# Patient Record
Sex: Female | Born: 1975 | Hispanic: Yes | Smoking: Current some day smoker
Health system: Southern US, Community
[De-identification: ages and names within clinical notes are randomized; demographics above are authoritative.]

## PROBLEM LIST (undated history)

## (undated) DIAGNOSIS — I10 Essential (primary) hypertension: Secondary | ICD-10-CM

## (undated) DIAGNOSIS — R519 Headache, unspecified: Secondary | ICD-10-CM

## (undated) DIAGNOSIS — R079 Chest pain, unspecified: Secondary | ICD-10-CM

## (undated) DIAGNOSIS — R002 Palpitations: Secondary | ICD-10-CM

## (undated) DIAGNOSIS — F419 Anxiety disorder, unspecified: Secondary | ICD-10-CM

## (undated) DIAGNOSIS — K5190 Ulcerative colitis, unspecified, without complications: Secondary | ICD-10-CM

## (undated) HISTORY — DX: Ulcerative colitis, unspecified, without complications: K51.90

## (undated) HISTORY — DX: Headache, unspecified: R51.9

## (undated) HISTORY — DX: Chest pain, unspecified: R07.9

## (undated) HISTORY — PX: ACHILLES TENDON SURGERY: SHX542

## (undated) HISTORY — DX: Palpitations: R00.2

## (undated) HISTORY — DX: Essential (primary) hypertension: I10

## (undated) HISTORY — DX: Anxiety disorder, unspecified: F41.9

## (undated) HISTORY — PX: APPENDECTOMY: SHX54

---

## 2016-04-09 ENCOUNTER — Telehealth: Payer: Self-pay | Admitting: *Deleted

## 2016-04-09 ENCOUNTER — Encounter: Payer: Self-pay | Admitting: Sports Medicine

## 2016-04-09 ENCOUNTER — Ambulatory Visit (INDEPENDENT_AMBULATORY_CARE_PROVIDER_SITE_OTHER): Payer: 59

## 2016-04-09 ENCOUNTER — Ambulatory Visit (INDEPENDENT_AMBULATORY_CARE_PROVIDER_SITE_OTHER): Payer: 59 | Admitting: Sports Medicine

## 2016-04-09 DIAGNOSIS — M799 Soft tissue disorder, unspecified: Secondary | ICD-10-CM

## 2016-04-09 DIAGNOSIS — L02619 Cutaneous abscess of unspecified foot: Secondary | ICD-10-CM

## 2016-04-09 DIAGNOSIS — M7989 Other specified soft tissue disorders: Secondary | ICD-10-CM

## 2016-04-09 DIAGNOSIS — L03119 Cellulitis of unspecified part of limb: Secondary | ICD-10-CM

## 2016-04-09 DIAGNOSIS — M79671 Pain in right foot: Secondary | ICD-10-CM

## 2016-04-09 MED ORDER — AMOXICILLIN-POT CLAVULANATE 875-125 MG PO TABS: 1.0000 | ORAL_TABLET | Freq: Two times a day (BID) | ORAL | 0 refills | Status: DC

## 2016-04-09 MED ORDER — METHYLPREDNISOLONE 4 MG PO TBPK: ORAL_TABLET | ORAL | 0 refills | Status: DC

## 2016-04-09 NOTE — Telephone Encounter (Addendum)
-----   Message from [place] St[place], [place] sent at 04/09/2016  9:40 AM EST ----- Regarding: MRI Right posterior heel Large soft tissue mass near achilles History of surgery as a child with this lump appearing 2 years ago with pain and swelling and drainage  Dr Kathie RhodesS. Orders given to D. Meadows for [employer].

## 2016-04-09 NOTE — Progress Notes (Signed)
Subjective: Elizabeth Burnett is a 41 y.o. female patient who presents to office for evaluation of Right heel pain. Patient complains of progressive pain especially over the last two years in the Right heel at the large mass at the back; reports that as a child she had injury that required tendon repair and casting. Patient states that things were normal with no pain or lump until about 2 years ago when she became pregnant the back of her heel started to swell and eventually opened up and started to drain; went to ER several weeks ago where they attempted to drain it with no improvement. Reports that it is extremely painful. Patient has tried epsom salt with no relief in symptoms. Patient denies any other pedal complaints.   There are no active problems to display for this patient.   No current outpatient prescriptions on file prior to visit.   No current facility-administered medications on file prior to visit.     Not on File  Objective:  General: Alert and oriented x3 in no acute distress  Dermatology: Soft tissue mass that engulfs the entire posterior heel with a central scabbed lesion at back of right heel with no active drainage, no webspace macerations, no ecchymosis bilateral, all nails x 10 are well manicured.  Vascular: Dorsalis Pedis and Posterior Tibial pedal pulses 2/4, Capillary Fill Time 3 seconds, + pedal hair growth bilateral, Temperature gradient within normal limits.  Neurology: Michaell CowingGross sensation intact via light touch bilateral.   Musculoskeletal: Moderate tenderness with palpation at insertion of the Achilles on Right, there is calcaneal exostosis with significant soft tissue mass present and decreased ankle rom with knee extending  vs flexed resembling gastroc equnius bilateral, The achilles tendon feels intact to the level of the mass on right, Thompson sign negative, Subtalar and midtarsal joint range of motion is within normal limits, there is no 1st ray hypermobility or  forefoot deformity noted bilateral.   Xrays  Right Foot    Impression: Normal osseous mineralization. Joint spaces preserved. No fracture/dislocation/boney destruction. Calcaneal spur present. Kager's triangle intact with no obliteration. + soft tissue abnormalities/mass at posterior heel without radiopaque foreign bodies.   Assessment and Plan: Problem List Items Addressed This Visit    None    Visit Diagnoses    Right foot pain    -  Primary   Relevant Orders   DG Foot 2 Views Right   CBC with Differential   Uric Acid   Sedimentation Rate   Basic Metabolic Panel   C-reactive protein   Cellulitis and abscess of foot, except toes       Relevant Orders   CBC with Differential   Uric Acid   Sedimentation Rate   Basic Metabolic Panel   C-reactive protein   Mass of soft tissue       Relevant Orders   CBC with Differential   Uric Acid   Sedimentation Rate   Basic Metabolic Panel   C-reactive protein      -Complete examination performed -Xrays reviewed -Discussed treatement options -Rx Medrol dose pack for pain and inflammation and Augmentin for preventive measures -Continue Epsom salt soaks and antibiotic cream to scabbed area -Ordered blood work to eval for infection or gout -Rx MRI to eval extent of mass at right posterior heel -No improvement will consider surgical excision  -Patient to return to office after MRI or sooner if condition worsens.  Asencion Islamitorya Jarel Cuadra, DPM

## 2016-04-10 LAB — SEDIMENTATION RATE: Sed Rate: 26 mm/hr (ref 0–32)

## 2016-04-10 LAB — CBC WITH DIFFERENTIAL/PLATELET
BASOS ABS: 0 10*3/uL (ref 0.0–0.2)
Basos: 0 %
EOS (ABSOLUTE): 0.8 10*3/uL — ABNORMAL HIGH (ref 0.0–0.4)
Eos: 11 %
HEMOGLOBIN: 13.8 g/dL (ref 11.1–15.9)
Hematocrit: 39.9 % (ref 34.0–46.6)
LYMPHS ABS: 2.1 10*3/uL (ref 0.7–3.1)
Lymphs: 29 %
MCH: 27.4 pg (ref 26.6–33.0)
MCHC: 34.6 g/dL (ref 31.5–35.7)
MCV: 79 fL (ref 79–97)
MONOCYTES: 10 %
MONOS ABS: 0.7 10*3/uL (ref 0.1–0.9)
Neutrophils Absolute: 3.6 10*3/uL (ref 1.4–7.0)
Neutrophils: 50 %
PLATELETS: 189 10*3/uL (ref 150–379)
RBC: 5.03 x10E6/uL (ref 3.77–5.28)
RDW: 12.6 % (ref 12.3–15.4)
WBC: 7.1 10*3/uL (ref 3.4–10.8)

## 2016-04-10 LAB — URIC ACID: Uric Acid: 4.5 mg/dL (ref 2.5–7.1)

## 2016-04-10 LAB — BASIC METABOLIC PANEL
BUN/Creatinine Ratio: 12 (ref 9–23)
BUN: 7 mg/dL (ref 6–24)
CO2: 24 mmol/L (ref 18–29)
Calcium: 9.2 mg/dL (ref 8.7–10.2)
Chloride: 103 mmol/L (ref 96–106)
Creatinine, Ser: 0.57 mg/dL (ref 0.57–1.00)
GFR calc Af Amer: 134 mL/min/{1.73_m2} (ref >59)
GFR calc non Af Amer: 116 mL/min/{1.73_m2} (ref >59)
GLUCOSE: 104 mg/dL — AB (ref 65–99)
Potassium: 3.6 mmol/L (ref 3.5–5.2)
SODIUM: 140 mmol/L (ref 134–144)

## 2016-04-10 LAB — C-REACTIVE PROTEIN: CRP: 8.1 mg/L — ABNORMAL HIGH (ref 0.0–4.9)

## 2016-04-10 NOTE — Telephone Encounter (Addendum)
-----   Message from [place] Stover, [place] sent at 04/10/2016 10:24 AM EST ----- Please call to let patient know that her inflammatory marker for C reactive protein was slightly elevated, likely supportive of inflammation at the back of the heel. However, her white blood cell count for infection was normal as well as all other blood work within normal limits. Please advise patient to continue with local wound care of soaking and antibiotic cream and continue with antibiotics I gave her on yesterday until she is finished since she does have a wound that drains. Please also advise patient to continue with steroid dose pack to help decrease the inflammation at the back of her heel. I will see patient in office again after her MRI. Thanks Dr. Marylene LandStover. 04/10/2016-left message for pt to call for results and instructions. 04/14/2016-Left Message with Dr. Wynema BirchStover's instructions and review of results, I told pt I felt it was important to leave the message, since I had not gotten a call back from 04/10/2016. 04/30/2016-[employer] [facility] APPROVED [id number] MRI WITH AND WITHOUT CONTRAST, NOTIFICATION #[id number], EXPIRING 06/13/2016. Connye BurkittKaren - [facility] scheduled pt for 05/02/2016 at 8:15am arrival and 8:30am MRI. Left message informing pt of appt date and [facility] location and [phone].

## 2016-06-27 ENCOUNTER — Ambulatory Visit (INDEPENDENT_AMBULATORY_CARE_PROVIDER_SITE_OTHER): Payer: 59 | Admitting: Sports Medicine

## 2016-06-27 ENCOUNTER — Encounter: Payer: Self-pay | Admitting: Sports Medicine

## 2016-06-27 DIAGNOSIS — S86011A Strain of right Achilles tendon, initial encounter: Secondary | ICD-10-CM

## 2016-06-27 DIAGNOSIS — M799 Soft tissue disorder, unspecified: Secondary | ICD-10-CM | POA: Diagnosis not present

## 2016-06-27 DIAGNOSIS — Z01818 Encounter for other preprocedural examination: Secondary | ICD-10-CM | POA: Diagnosis not present

## 2016-06-27 DIAGNOSIS — L03119 Cellulitis of unspecified part of limb: Secondary | ICD-10-CM

## 2016-06-27 DIAGNOSIS — L02619 Cutaneous abscess of unspecified foot: Secondary | ICD-10-CM | POA: Diagnosis not present

## 2016-06-27 DIAGNOSIS — M7989 Other specified soft tissue disorders: Secondary | ICD-10-CM

## 2016-06-27 MED ORDER — MUPIROCIN 2 % EX OINT: 1.0000 "application " | TOPICAL_OINTMENT | Freq: Two times a day (BID) | CUTANEOUS | 1 refills | Status: DC

## 2016-06-27 NOTE — Patient Instructions (Signed)
Pre-Operative Instructions  Congratulations, you have decided to take an important step to improving your quality of life.  You can be assured that the doctors of [facility] will be with you every step of the way.  1. Plan to be at the surgery center/hospital at least 1 (one) hour prior to your scheduled time unless otherwise directed by the surgical center/hospital staff.  You must have a responsible adult accompany you, remain during the surgery and drive you home.  Make sure you have directions to the surgical center/hospital and know how to get there on time. 2. For hospital based surgery you will need to obtain a history and physical form from your family physician within 1 month prior to the date of surgery- we will give you a form for you primary physician.  3. We make every effort to accommodate the date you request for surgery.  There are however, times where surgery dates or times have to be moved.  We will contact you as soon as possible if a change in schedule is required.   4. No Aspirin/Ibuprofen for one week before surgery.  If you are on aspirin, any non-steroidal anti-inflammatory medications (Mobic, Aleve, Ibuprofen) you should stop taking it 7 days prior to your surgery.  You make take Tylenol  For pain prior to surgery.  5. Medications- If you are taking daily heart and blood pressure medications, seizure, reflux, allergy, asthma, anxiety, pain or diabetes medications, make sure the surgery center/hospital is aware before the day of surgery so they may notify you which medications to take or avoid the day of surgery. 6. No food or drink after midnight the night before surgery unless directed otherwise by surgical center/hospital staff. 7. No alcoholic beverages 24 hours prior to surgery.  No smoking 24 hours prior to or 24 hours after surgery. 8. Wear loose pants or shorts- loose enough to fit over bandages, boots, and casts. 9. No slip on shoes, sneakers are best. 10. Bring  your boot with you to the surgery center/hospital.  Also bring crutches or a walker if your physician has prescribed it for you.  If you do not have this equipment, it will be provided for you after surgery. 11. If you have not been contracted by the surgery center/hospital by the day before your surgery, call to confirm the date and time of your surgery. 12. Leave-time from work may vary depending on the type of surgery you have.  Appropriate arrangements should be made prior to surgery with your employer. 13. Prescriptions will be provided immediately following surgery by your doctor.  Have these filled as soon as possible after surgery and take the medication as directed. 14. Remove nail polish on the operative foot. 15. Wash the night before surgery.  The night before surgery wash the foot and leg well with the antibacterial soap provided and water paying special attention to beneath the toenails and in between the toes.  Rinse thoroughly with water and dry well with a towel.  Perform this wash unless told not to do so by your physician.  Enclosed: 1 Ice pack (please put in freezer the night before surgery)   1 Hibiclens skin cleaner   Pre-op Instructions  If you have any questions regarding the instructions, do not hesitate to call our office.  [place]: [address] [place], [place] [address] [phone]  [place]: [address] [place], [place] [address] [phone]  [place]: [address]  [place], [place] [address] [phone]   Dr.   Norman Regal DPM, Dr. Matthew Wagoner DPM, Dr. M. Todd Hyatt DPM, Dr. Kasia Trego DPM 

## 2016-06-28 NOTE — Progress Notes (Signed)
Subjective: Elizabeth Burnett is a 41 y.o. female patient who returns to office for evaluation of Right heel pain and for MRI results. Patient states that she went to [place] and saw her mom's doctor who looked at the wound and said she has a staph infection and attempted a biopsy which was painful and she is unsure of what the results are. Patient reports that she is in a lot of pain and is tired of dealing with her heel this way; Nothing can touch her heel because its so painful. Patient has been soaking with epsom salt and scrubbing the scab off and apply neosporin. Patient denies any other pedal complaints.   There are no active problems to display for this patient.   Current Outpatient Prescriptions on File Prior to Visit  Medication Sig Dispense Refill  . amoxicillin-clavulanate (AUGMENTIN) 875-125 MG tablet Take 1 tablet by mouth 2 (two) times daily. 28 tablet 0  . methylPREDNISolone (MEDROL DOSEPAK) 4 MG TBPK tablet Take as instructed 21 tablet 0   No current facility-administered medications on file prior to visit.     No Known Allergies   Social History   Social History  . Marital status: Unknown    Spouse name: N/A  . Number of children: N/A  . Years of education: N/A   Social History Main Topics  . Smoking status: Never Smoker  . Smokeless tobacco: Never Used  . Alcohol use None  . Drug use: Unknown  . Sexual activity: Not Asked   Other Topics Concern  . None   Social History Narrative  . None   No family history on file.   No past surgical history on file.  Objective:  General: Alert and oriented x3 in no acute distress  Dermatology: Soft tissue mass that engulfs the entire posterior heel with a central scabbed lesion at back of right heel with no active drainage however soupy appearance and mild focal erythema, no webspace macerations, no ecchymosis bilateral, all nails x 10 are well manicured.  Vascular: Dorsalis Pedis and Posterior Tibial pedal pulses 2/4,  Capillary Fill Time 3 seconds, + pedal hair growth bilateral, Temperature gradient within normal limits.  Neurology: Michaell Cowing sensation intact via light touch bilateral.   Musculoskeletal: Moderate tenderness with palpation at insertion of the Achilles on Right, there is calcaneal exostosis with significant soft tissue mass present and decreased ankle rom with knee extending  vs flexed resembling gastroc equnius bilateral, The achilles tendon feels intact to the level of the mass on right, Thompson sign negative, Subtalar and midtarsal joint range of motion is within normal limits, there is no 1st ray hypermobility or forefoot deformity noted bilateral.   MRI Right Tendonosis Achilles, split tear, foreign bodies, edema, insertion bone changes  Assessment and Plan: Problem List Items Addressed This Visit    None    Visit Diagnoses    Soft tissue mass    -  Primary   Relevant Orders   WOUND CULTURE   Achilles tendon tear, right, initial encounter       Relevant Orders   WOUND CULTURE   Cellulitis and abscess of foot, except toes       Relevant Medications   mupirocin ointment (BACTROBAN) 2 %   Other Relevant Orders   WOUND CULTURE   Pre-op exam       Relevant Orders   DME Crutches   WOUND CULTURE      -Complete examination performed -MRI reviewed -Discussed treatement options -Rx bactroban cream  -Continue  Epsom salt soaks and cream as Rx to scabbed area -Wound culture obtained will call patient if she needs another round of oral antibioitcs; recommend finish 2 week course of antibiotics first before we proceed with surgery; Will call patient with culture results -Patient opt for surgical management. Consent obtained for Excision of mass and achilles tendon repair right. Pre and Post op course explained. Risks, benefits, alternatives explained. No guarantees given or implied. Surgical booking slip submitted and provided patient [facility] and [facility] -Dispensed CAM Walker and Crutches to use post op. Advised patient that I may cast her 1st before allowing her to use the CAM walker. -Advised post op at minimum no work for 4 weeks and once return will be limited to desk duty only  -Patient to return to office after surgery or sooner if condition worsens.  [name], DPM

## 2016-06-30 ENCOUNTER — Telehealth: Payer: Self-pay | Admitting: *Deleted

## 2016-06-30 NOTE — Telephone Encounter (Addendum)
"  I need to schedule my surgery with Dr. Marylene LandStover."  Do you have a date in mind?  She does surgery on Mondays.  "She has me taking an antibiotic for a week.  So, she said two weeks after that."  She can do it on April 16.  "That date will be fine."  Someone from the surgical center will call you with the arrival time the Friday before.

## 2016-07-02 ENCOUNTER — Telehealth: Payer: Self-pay | Admitting: Sports Medicine

## 2016-07-02 NOTE — Telephone Encounter (Signed)
Per pt her job needs to know how long she  is pt going to be out of work with no weight bearing. What would be the earliest she will be able to go back to work? Desk duty/light duty etc.

## 2016-07-02 NOTE — Telephone Encounter (Signed)
From my last note on 06-27-16 please let patient know  "Advised post op at minimum no work for 4 weeks and once return will be limited to desk duty only"  4 weeks no work will begin the date of surgery  Each visit post op patient will be assessed to determine when she can return to work and duty restrictions -Dr. Marylene LandStover

## 2016-07-03 ENCOUNTER — Telehealth: Payer: Self-pay | Admitting: *Deleted

## 2016-07-03 MED ORDER — AMOXICILLIN-POT CLAVULANATE 875-125 MG PO TABS: 1.0000 | ORAL_TABLET | Freq: Two times a day (BID) | ORAL | 0 refills | Status: DC

## 2016-07-03 NOTE — Telephone Encounter (Addendum)
-----   Message from Asencion Islamitorya Stover, [place] sent at 07/03/2016  3:28 PM EDT ----- Can you let patient know to Continue her Augmentin antibiotic and that her wound culture resulted in Staph infection.  Thanks -Dr. Marylene LandStover. I informed pt of Dr. Wynema BirchStover's orders and [phone].

## 2016-07-04 ENCOUNTER — Encounter: Payer: Self-pay | Admitting: *Deleted

## 2016-07-04 NOTE — Telephone Encounter (Addendum)
I informed pt of D. Stover's requirements for post op work status. Pt states understanding and would like a note emailed to her for [employer]. Letter scanned an emailed to pt's [email or web address] by C. Clark.

## 2016-07-10 NOTE — Telephone Encounter (Signed)
I left patient a message to call me back.  I'm calling to verify that she is scheduled for surgery on 07/21/2016.

## 2016-07-14 ENCOUNTER — Telehealth: Payer: Self-pay | Admitting: *Deleted

## 2016-07-14 NOTE — Telephone Encounter (Signed)
"  I am returning your call from Friday."  I did not call you.  It may have been someone from the surgical center.  "I accidentally deleted the message so I couldn't go back and look at it.  I do have a question though.  I just recently moved here.  I do not have a primary care doctor to get the physical from.  Will that be okay?"  No, that is not okay.  You will not be able to have the surgery if you do not have the history and physical form completed.  "So what should I do?"  You may want to go to an Urgent Care and get a physical.  "I will see what I can do if there's a problem  I will let you know."

## 2016-07-18 ENCOUNTER — Telehealth: Payer: Self-pay | Admitting: *Deleted

## 2016-07-18 NOTE — Telephone Encounter (Signed)
"  This is Elizabeth Burnett from the surgical center.  I just spoke to [facility].  She has not gotten her H&P.  We may have to reschedule her surgery.  What would you like for me to do?  History and physical forms cannot be filled out through an Urgent Care doctor."  I will call the patient to see what she wants to do.  "This is the Nurse Practitioner from [facility] in [place].  I have this patient here requesting to be seen.  She stated she has a surgery scheduled for Monday.  We don't know what we are supposed to do for her.  We have never seen her before."  She needs a history and physical done and the form completed and sent to the surgical center.  "We have never seen her so we can't fill it out.  When we do physicals we normally require an EKG and labs.  We can get the results back for that by Monday.  We actually have to send our EKG to a Cardiologist at the hospital to be read."  Okay that is fine, we will have to reschedule her surgery.  The patient was informed that she needed this earlier.  "She said she just got the form today."  Patient was advised earlier.  "I have no problem seeing her today."  I will give her a call to reschedule.  I am calling to let you know that we are going to have to reschedule your surgery due to you not having your history and physical done at this time.  "I am trying to get it done.  I am running around like a chicken with my head cut off trying to get someone to do this.  I am upset because the lady at the surgical center didn't say anything to me the other day when I told her I did not have a primary care physician.  Now I risk losing my date, I have had arrangements made for FMLA.  I am so upset."  I informed you when I spoke to you the other day that you needed to have a physical.  "Yes, I know but when I spoke to the lady at surgery center later after I spoke to you, she didn't say anything to me about it when I told her I didn't have a primary care doctor.   Please don't cancel my surgery.  I am working very hard to get the physical.  What time do you close?"  We close at 4:00 pm on Fridays.  "I will let you know something by then."

## 2016-07-18 NOTE — Telephone Encounter (Signed)
"  This is Elizabeth Burnett calling from [facility].  Calling concerning her surgery scheduled for Monday.  Please give me a call back."  "I'm not happy but I guess I will have to reschedule my surgery.  I have an appointment for a physical on Monday."  She may be able to do it on April 23.  I will see if it's available.  If it's not, I will give you a call.  "Okay, thank you."  I called and left a message for Elizabeth Burnett at [facility] to give me a call back.  I informed her that I need to reschedule patient's surgery scheduled from 07/21/2016 to 07/28/2016.  Please let me know if it is available.  I returned Elizabeth Burnett's call and informed her we need to reschedule surgery.  Patient is scheduled to have a history and physical performed on Monday.  Patient wants to have surgery done on Monday, 07/28/2016.  She transferred me to Elizabeth Burnett said that April 23 was open and moved patient's surgery to 07/28/2016.

## 2016-07-28 ENCOUNTER — Encounter: Payer: Self-pay | Admitting: Sports Medicine

## 2016-07-28 DIAGNOSIS — S86002D Unspecified injury of left Achilles tendon, subsequent encounter: Secondary | ICD-10-CM

## 2016-07-28 DIAGNOSIS — M25774 Osteophyte, right foot: Secondary | ICD-10-CM | POA: Diagnosis not present

## 2016-07-28 DIAGNOSIS — M7661 Achilles tendinitis, right leg: Secondary | ICD-10-CM | POA: Diagnosis not present

## 2016-07-29 ENCOUNTER — Telehealth: Payer: Self-pay | Admitting: Sports Medicine

## 2016-07-29 NOTE — Telephone Encounter (Signed)
Patient reports that she is in a lot of pain with throbbing and is taking the pain medication every 6 hours however feels like it's not helping; states that she is getting intense flares of pain and that when she takes the medication it only helps for about 1 hour. Patient advised to continue with elevation, ice, and may take Dilaudid every 4 hours instead of 6 hours. I advised patient that if by 1pm she is still in pain to call office for a new Rx for pain medication  Patient expressed understanding and was made aware if new Rx is needed her husband will have to come to [place] office to pick it up. -Dr. Marylene Land

## 2016-07-30 ENCOUNTER — Telehealth: Payer: Self-pay | Admitting: *Deleted

## 2016-07-30 ENCOUNTER — Encounter: Payer: 59 | Admitting: Sports Medicine

## 2016-07-30 NOTE — Telephone Encounter (Signed)
Elizabeth Burnett request DOS and date of follow up, Claim# [id number].

## 2016-08-06 ENCOUNTER — Encounter: Payer: Self-pay | Admitting: Sports Medicine

## 2016-08-06 ENCOUNTER — Ambulatory Visit (INDEPENDENT_AMBULATORY_CARE_PROVIDER_SITE_OTHER): Payer: 59 | Admitting: Sports Medicine

## 2016-08-06 ENCOUNTER — Ambulatory Visit (INDEPENDENT_AMBULATORY_CARE_PROVIDER_SITE_OTHER): Payer: 59

## 2016-08-06 DIAGNOSIS — M7989 Other specified soft tissue disorders: Secondary | ICD-10-CM

## 2016-08-06 DIAGNOSIS — S86011D Strain of right Achilles tendon, subsequent encounter: Secondary | ICD-10-CM

## 2016-08-06 DIAGNOSIS — S86011A Strain of right Achilles tendon, initial encounter: Secondary | ICD-10-CM

## 2016-08-06 DIAGNOSIS — M79671 Pain in right foot: Secondary | ICD-10-CM

## 2016-08-06 DIAGNOSIS — Z9889 Other specified postprocedural states: Secondary | ICD-10-CM

## 2016-08-06 DIAGNOSIS — M799 Soft tissue disorder, unspecified: Secondary | ICD-10-CM

## 2016-08-06 MED ORDER — HYDROMORPHONE HCL 4 MG PO TABS: 4.0000 mg | ORAL_TABLET | ORAL | 0 refills | Status: DC | PRN

## 2016-08-06 NOTE — Progress Notes (Signed)
Subjective: Elizabeth Burnett is a 41 y.o. female patient seen today in office for POV #1 (DOS 07-28-16), S/P removal of soft tissue mass at Achilles with Achilles tendon repair using suture anchor, Patient denies pain at surgical site, denies calf pain, denies headache, chest pain, shortness of breath, nausea, vomiting, fever, or chills. Patient states that pain was initially bad the first 3 or 4 days but then started to be resolved. States otherwise cleaned all of her pain medicines and have been without pain meds for the last 2-3 days and has done okay. However, last night, had an episode of pain. No other issues noted.   There are no active problems to display for this patient.   Current Outpatient Prescriptions on File Prior to Visit  Medication Sig Dispense Refill  . amoxicillin-clavulanate (AUGMENTIN) 875-125 MG tablet Take 1 tablet by mouth 2 (two) times daily. 28 tablet 0  . etonogestrel (NEXPLANON) 68 MG IMPL implant by Subdermal route.    . methylPREDNISolone (MEDROL DOSEPAK) 4 MG TBPK tablet Take as instructed 21 tablet 0  . mupirocin ointment (BACTROBAN) 2 % Apply 1 application topically 2 (two) times daily. To heel wound 22 g 1   No current facility-administered medications on file prior to visit.     No Known Allergies  Objective: There were no vitals filed for this visit.  General: No acute distress, AAOx3  Right foot: Sutures intact along Achilles with no gapping or dehiscence at surgical site, mild swelling to right posterior heel, no erythema, no warmth, no drainage, no signs of infection noted, Capillary fill time <3 seconds in all digits, gross sensation present via light touch to right foot. Mild guarding to right foot and ankle. No pain with calf compression.   Post Op Xray, Right foot: Posterior heel natural contour with evidence of suture anchor at calcaneus, soft tissue swelling within normal limits for post op status.   Assessment and Plan:  Problem List Items  Addressed This Visit    None    Visit Diagnoses    S/P foot surgery, right    -  Primary   Relevant Medications   HYDROmorphone (DILAUDID) 4 MG tablet   Partial Achilles tendon tear, right, subsequent encounter       Relevant Orders   DG Foot Complete Right   Soft tissue mass       Right foot pain           -Patient seen and evaluated -Fiberglass cast removed -X-rays reviewed -Applied dry sterile dressing to surgical site right foot secured with ACE wrap and stockinet  -Advised patient to make sure to keep dressings clean, dry, and intact to right surgical site, removing the ACE as needed  -Advised patient to continue with CAM boot and crutches, no weight on right foot -Advised patient to limit activity to necessity  -Advised patient to ice and elevate as necessary  -Refill pain medicine, Dilaudid to take as instructed -Patient to continue with oral antibiotics of Augmentin until completed -Will plan for suture removal If ready at next office visit. In the meantime, patient to call office if any issues or problems arise. Advised patient that after all sutures are removed, We'll start to allow the toe touch weightbearing with CAM boot.   Asencion Islam, DPM

## 2016-08-13 ENCOUNTER — Ambulatory Visit (INDEPENDENT_AMBULATORY_CARE_PROVIDER_SITE_OTHER): Payer: Self-pay | Admitting: Sports Medicine

## 2016-08-13 ENCOUNTER — Encounter: Payer: Self-pay | Admitting: Sports Medicine

## 2016-08-13 DIAGNOSIS — M79671 Pain in right foot: Secondary | ICD-10-CM

## 2016-08-13 DIAGNOSIS — Z9889 Other specified postprocedural states: Secondary | ICD-10-CM

## 2016-08-13 NOTE — Progress Notes (Signed)
Subjective: Elizabeth Burnett is a 41 y.o. female patient seen today in office for POV #2 (DOS 07-28-16), S/P removal of soft tissue mass at Achilles with Achilles tendon repair using suture anchor, Patient denies pain at surgical site, denies calf pain, denies headache, chest pain, shortness of breath, nausea, vomiting, fever, or chills. Patient states that FMLA paperwork needs to be updated. No other issues noted.   There are no active problems to display for this patient.   Current Outpatient Prescriptions on File Prior to Visit  Medication Sig Dispense Refill  . amoxicillin-clavulanate (AUGMENTIN) 875-125 MG tablet Take 1 tablet by mouth 2 (two) times daily. 28 tablet 0  . etonogestrel (NEXPLANON) 68 MG IMPL implant by Subdermal route.    Marland Kitchen. HYDROmorphone (DILAUDID) 4 MG tablet Take 1 tablet (4 mg total) by mouth every 4 (four) hours as needed for severe pain. 21 tablet 0  . methylPREDNISolone (MEDROL DOSEPAK) 4 MG TBPK tablet Take as instructed 21 tablet 0  . mupirocin ointment (BACTROBAN) 2 % Apply 1 application topically 2 (two) times daily. To heel wound 22 g 1   No current facility-administered medications on file prior to visit.     No Known Allergies  Objective: There were no vitals filed for this visit.  General: No acute distress, AAOx3  Right foot: Sutures intact along Achilles with no gapping or dehiscence at surgical site, mild bloody drainage, mild swelling to right posterior heel, no erythema, no warmth, no drainage, no signs of infection noted, Capillary fill time <3 seconds in all digits, gross sensation present via light touch to right foot. Mild guarding to right foot and ankle. No pain with calf compression.   Assessment and Plan:  Problem List Items Addressed This Visit    None    Visit Diagnoses    S/P foot surgery, right    -  Primary   Right foot pain           -Patient seen and evaluated -Applied dry sterile dressing to surgical site right foot secured with  ACE wrap and stockinet  -Advised patient to make sure to keep dressings clean, dry, and intact to right surgical site, removing the ACE as needed  -Advised patient to continue with CAM boot and crutches, no weight on right foot -Advised patient to limit activity to necessity  -Advised patient to ice and elevate as necessary  -Continue with Dilaudid to take as instructed for severe pain only  -Augmentin completed  -Will plan for removal of some sutures at next office visit. In the meantime, patient to call office if any issues or problems arise. Advised patient that after all sutures are removed, We'll start to allow the toe touch weightbearing with CAM boot. Continue with no work until 08-27-16; May have to extend time from work depending on how patient is doing clinically.   Asencion Islamitorya Verdean Murin, DPM

## 2016-08-14 ENCOUNTER — Encounter: Payer: 59 | Admitting: Sports Medicine

## 2016-08-20 ENCOUNTER — Encounter: Payer: Self-pay | Admitting: Sports Medicine

## 2016-08-20 ENCOUNTER — Ambulatory Visit (INDEPENDENT_AMBULATORY_CARE_PROVIDER_SITE_OTHER): Payer: 59 | Admitting: Sports Medicine

## 2016-08-20 DIAGNOSIS — Z9889 Other specified postprocedural states: Secondary | ICD-10-CM

## 2016-08-20 DIAGNOSIS — M79671 Pain in right foot: Secondary | ICD-10-CM

## 2016-08-20 NOTE — Progress Notes (Signed)
Subjective: Elizabeth Burnett is a 41 y.o. female patient seen today in office for POV #3 (DOS 07-28-16), S/P removal of soft tissue mass at Achilles with Achilles tendon repair using suture anchor, Patient denies pain at surgical site, denies calf pain, denies headache, chest pain, shortness of breath, nausea, vomiting, fever, or chills. States that she has been doing everything as instructed. No other issues noted.   There are no active problems to display for this patient.   Current Outpatient Prescriptions on File Prior to Visit  Medication Sig Dispense Refill  . amoxicillin-clavulanate (AUGMENTIN) 875-125 MG tablet Take 1 tablet by mouth 2 (two) times daily. 28 tablet 0  . etonogestrel (NEXPLANON) 68 MG IMPL implant by Subdermal route.    Marland Kitchen. HYDROmorphone (DILAUDID) 4 MG tablet Take 1 tablet (4 mg total) by mouth every 4 (four) hours as needed for severe pain. 21 tablet 0  . methylPREDNISolone (MEDROL DOSEPAK) 4 MG TBPK tablet Take as instructed 21 tablet 0  . mupirocin ointment (BACTROBAN) 2 % Apply 1 application topically 2 (two) times daily. To heel wound 22 g 1   No current facility-administered medications on file prior to visit.     No Known Allergies  Objective: There were no vitals filed for this visit.  General: No acute distress, AAOx3  Right foot: Sutures intact along Achilles with no gapping or dehiscence at surgical site, mild bloody drainage centrally with ruptured blistered skin, mild swelling to right posterior heel, no erythema, no warmth, no drainage, no signs of infection noted, Capillary fill time <3 seconds in all digits, gross sensation present via light touch to right foot. Mild guarding to right foot and ankle. No pain with calf compression.   Assessment and Plan:  Problem List Items Addressed This Visit    None    Visit Diagnoses    S/P foot surgery, right    -  Primary   Relevant Orders   DME Other see comment   Right foot pain           -Patient seen  and evaluated -A few sutures were removed, applied dry sterile dressing to surgical site right foot secured with ACE wrap and stockinet  -Advised patient to make sure to keep dressings clean, dry, and intact to right surgical site, removing the ACE as needed  -Advised patient to continue with CAM boot and crutches, no weight on right foot may take boot off when resting to decrease excessive pressure to heel  -Rx Rolling knee scooter  -Advised patient to limit activity to necessity  -Advised patient to ice and elevate as necessary  -Continue with Dilaudid to take as instructed for severe pain only  -Will plan for removal of more sutures at next office visit. In the meantime, patient to call office if any issues or problems arise. Advised patient that after all sutures are removed, We'll start to allow the toe touch weightbearing with CAM boot. Continue with no work until 08-27-16; return to work will be determined at next office visit.  Asencion Islamitorya Lanorris Kalisz, DPM

## 2016-08-26 NOTE — Progress Notes (Signed)
1. Right excision of mass 2. Achilles tendon repair with possible graft and anchor

## 2016-08-29 ENCOUNTER — Ambulatory Visit (INDEPENDENT_AMBULATORY_CARE_PROVIDER_SITE_OTHER): Payer: Self-pay | Admitting: Sports Medicine

## 2016-08-29 ENCOUNTER — Encounter: Payer: Self-pay | Admitting: Sports Medicine

## 2016-08-29 DIAGNOSIS — M79671 Pain in right foot: Secondary | ICD-10-CM

## 2016-08-29 DIAGNOSIS — Z9889 Other specified postprocedural states: Secondary | ICD-10-CM

## 2016-08-29 NOTE — Progress Notes (Signed)
Subjective: Elizabeth Burnett is a 41 y.o. female patient seen today in office for POV #4 (DOS 07-28-16), S/P removal of soft tissue mass at Achilles with Achilles tendon repair using suture anchor, Patient denies pain at surgical site, denies calf pain, denies headache, chest pain, shortness of breath, nausea, vomiting, fever, or chills. No other issues noted.   There are no active problems to display for this patient.   Current Outpatient Prescriptions on File Prior to Visit  Medication Sig Dispense Refill  . amoxicillin-clavulanate (AUGMENTIN) 875-125 MG tablet Take 1 tablet by mouth 2 (two) times daily. 28 tablet 0  . docusate sodium (COLACE) 100 MG capsule Take 100 mg by mouth 2 (two) times daily.    Marland Kitchen. etonogestrel (NEXPLANON) 68 MG IMPL implant by Subdermal route.    Marland Kitchen. HYDROmorphone (DILAUDID) 4 MG tablet Take 1 tablet (4 mg total) by mouth every 4 (four) hours as needed for severe pain. 21 tablet 0  . methylPREDNISolone (MEDROL DOSEPAK) 4 MG TBPK tablet Take as instructed 21 tablet 0  . mupirocin ointment (BACTROBAN) 2 % Apply 1 application topically 2 (two) times daily. To heel wound 22 g 1  . promethazine (PHENERGAN) 25 MG tablet Take 25 mg by mouth every 8 (eight) hours as needed for nausea or vomiting.     No current facility-administered medications on file prior to visit.     No Known Allergies  Objective: There were no vitals filed for this visit.  General: No acute distress, AAOx3  Right foot: Remaining Sutures intact along Achilles with no gapping or dehiscence at surgical site, mild bloody drainage centrally with ruptured blistered skin, improving, mild swelling to right posterior heel, no erythema, no warmth, no drainage, no signs of infection noted, Capillary fill time <3 seconds in all digits, gross sensation present via light touch to right foot. Mild guarding to right foot and ankle. No pain with calf compression.   Assessment and Plan:  Problem List Items Addressed This  Visit    None    Visit Diagnoses    S/P foot surgery, right    -  Primary   Right foot pain           -Patient seen and evaluated -Remaining sutures were removed, applied dry sterile dressing to surgical site right foot secured with ACE wrap and stockinet  -Advised patient to make sure to keep dressings clean, dry, and intact to right surgical site and redress in 1 week as instructed -Advised patient to continue with CAM boot and crutches/rolling knee scooter; may take boot off when resting to decrease excessive pressure to heel   -Advised patient to limit activity to necessity  -Advised patient to ice and elevate as necessary  -Continue with Dilaudid to take as instructed for severe pain only  -Will plan for full weightbearing with CAM boot at next office visit. In the meantime, patient to call office if any issues or problems arise. Return to work 09-08-16 with rolling knee scooter and only toe touch pressure to right foot.  Asencion Islamitorya Marisha Renier, DPM

## 2016-09-05 ENCOUNTER — Other Ambulatory Visit: Payer: Self-pay | Admitting: *Deleted

## 2016-09-05 MED ORDER — AMOXICILLIN-POT CLAVULANATE 875-125 MG PO TABS: 1.0000 | ORAL_TABLET | Freq: Two times a day (BID) | ORAL | 0 refills | Status: DC

## 2016-09-05 NOTE — Progress Notes (Signed)
Refill antibiotic per Dr Marylene LandStover for light drainage at surgical site

## 2016-09-11 ENCOUNTER — Ambulatory Visit (INDEPENDENT_AMBULATORY_CARE_PROVIDER_SITE_OTHER): Payer: 59 | Admitting: Sports Medicine

## 2016-09-11 DIAGNOSIS — Z9889 Other specified postprocedural states: Secondary | ICD-10-CM | POA: Diagnosis not present

## 2016-09-11 DIAGNOSIS — M79671 Pain in right foot: Secondary | ICD-10-CM

## 2016-09-11 NOTE — Progress Notes (Signed)
Subjective: Elizabeth Burnett is a 41 y.o. female patient seen today in office for POV #5 (DOS 07-28-16), S/P removal of soft tissue mass at Achilles with Achilles tendon repair using suture anchor, Patient denies pain at surgical site, denies calf pain, denies headache, chest pain, shortness of breath, nausea, vomiting, fever, or chills. Reports that she is adjusting to being back to work and is feeling a little overwhelmed. No other issues noted.   There are no active problems to display for this patient.   Current Outpatient Prescriptions on File Prior to Visit  Medication Sig Dispense Refill  . amoxicillin-clavulanate (AUGMENTIN) 875-125 MG tablet Take 1 tablet by mouth 2 (two) times daily. 28 tablet 0  . docusate sodium (COLACE) 100 MG capsule Take 100 mg by mouth 2 (two) times daily.    Marland Kitchen. etonogestrel (NEXPLANON) 68 MG IMPL implant by Subdermal route.    Marland Kitchen. HYDROmorphone (DILAUDID) 4 MG tablet Take 1 tablet (4 mg total) by mouth every 4 (four) hours as needed for severe pain. 21 tablet 0  . methylPREDNISolone (MEDROL DOSEPAK) 4 MG TBPK tablet Take as instructed 21 tablet 0  . mupirocin ointment (BACTROBAN) 2 % Apply 1 application topically 2 (two) times daily. To heel wound 22 g 1  . promethazine (PHENERGAN) 25 MG tablet Take 25 mg by mouth every 8 (eight) hours as needed for nausea or vomiting.     No current facility-administered medications on file prior to visit.     No Known Allergies  Objective: There were no vitals filed for this visit.  General: No acute distress, AAOx3  Right foot: Incision intact along Achilles with no gapping or dehiscence at surgical site, no bloody drainage centrally with ruptured blistered skin, that is improved, mild swelling to right posterior heel, no erythema, no warmth, no drainage, no signs of infection noted, Capillary fill time <3 seconds in all digits, gross sensation present via light touch to right foot. Mild guarding to right foot and ankle. No  pain with calf compression.   Assessment and Plan:  Problem List Items Addressed This Visit    None    Visit Diagnoses    S/P foot surgery, right    -  Primary   Right foot pain           -Patient seen and evaluated -Applied Compression anklet to wear daily as instructed -Advised patient to continue with CAM boot and may now weight-bear and may use crutches for stability for short distances and rolling knee scooter for long distances -Advised patient to limit activity to necessity  -Advised patient to ice and elevate as necessary  -Continue with Motrin or Tylenol as needed for pain -Continue with work and use of knee scooter when at work -Will plan for home physical therapy and transitioning patient out of boot if she is able to tolerate full weightbearing at next office visit. In the meantime, patient to call office if any issues or problems arise.  Asencion Islamitorya Tiffanie Blassingame, DPM

## 2016-09-25 ENCOUNTER — Ambulatory Visit (INDEPENDENT_AMBULATORY_CARE_PROVIDER_SITE_OTHER): Payer: Self-pay | Admitting: Sports Medicine

## 2016-09-25 DIAGNOSIS — M79671 Pain in right foot: Secondary | ICD-10-CM

## 2016-09-25 DIAGNOSIS — Z9889 Other specified postprocedural states: Secondary | ICD-10-CM

## 2016-09-25 NOTE — Progress Notes (Signed)
Subjective: Elizabeth Burnett is a 41 y.o. female patient seen today in office for POV #6 (DOS 07-28-16), S/P removal of soft tissue mass at Achilles with Achilles tendon repair using suture anchor, Patient denies pain at surgical site except at end of day of when she attempts to put full weight states it feel like her ankle is going to give out and hurts at bottom of heel so has been using crutches for shorter distances and rolling knee scooter for longer distances, denies calf pain, denies headache, chest pain, shortness of breath, nausea, vomiting, fever, or chills. States that she is not taking anything for pain. No other issues noted.   There are no active problems to display for this patient.   Current Outpatient Prescriptions on File Prior to Visit  Medication Sig Dispense Refill  . amoxicillin-clavulanate (AUGMENTIN) 875-125 MG tablet Take 1 tablet by mouth 2 (two) times daily. 28 tablet 0  . docusate sodium (COLACE) 100 MG capsule Take 100 mg by mouth 2 (two) times daily.    Marland Kitchen. etonogestrel (NEXPLANON) 68 MG IMPL implant by Subdermal route.    Marland Kitchen. HYDROmorphone (DILAUDID) 4 MG tablet Take 1 tablet (4 mg total) by mouth every 4 (four) hours as needed for severe pain. 21 tablet 0  . methylPREDNISolone (MEDROL DOSEPAK) 4 MG TBPK tablet Take as instructed 21 tablet 0  . mupirocin ointment (BACTROBAN) 2 % Apply 1 application topically 2 (two) times daily. To heel wound 22 g 1  . promethazine (PHENERGAN) 25 MG tablet Take 25 mg by mouth every 8 (eight) hours as needed for nausea or vomiting.     No current facility-administered medications on file prior to visit.     No Known Allergies  Objective: There were no vitals filed for this visit.  General: No acute distress, AAOx3  Right foot: Incision intact along Achilles with no gapping or dehiscence at surgical site, mild scabbing, no bloody drainage, mild swelling to right posterior heel, no erythema, no warmth, no drainage, no signs of infection  noted, Capillary fill time <3 seconds in all digits, gross sensation present via light touch to right foot. Mild guarding to right foot and ankle. No pain with calf compression.   Assessment and Plan:  Problem List Items Addressed This Visit    None    Visit Diagnoses    S/P foot surgery, right    -  Primary   Right foot pain          -Patient seen and evaluated -Continue with Compression anklet to wear daily as instructed -Advised patient to continue with CAM boot and may continue with weight-bearing to tolerance and may use crutches for stability for short distances and rolling knee scooter for long distances -[facility]vised home PT [facility] -Advised patient to limit activity to necessity  -Advised patient to ice and elevate as necessary  -Continue with Motrin or Tylenol as needed for pain -Continue with work and use of knee scooter when at work if needed  -Will plan for transition patient out of boot if not already out with assistance of PT at next office visit. In the meantime, patient to call office if any issues or problems arise.  Elizabeth Burnett, DPM

## 2016-10-23 ENCOUNTER — Encounter: Payer: 59 | Admitting: Sports Medicine

## 2017-07-12 ENCOUNTER — Emergency Department (HOSPITAL_COMMUNITY)
Admission: EM | Admit: 2017-07-12 | Discharge: 2017-07-12 | Disposition: A | Discharge status: Home / Self Care | Payer: Self-pay | Attending: Emergency Medicine | Admitting: Emergency Medicine

## 2017-07-12 ENCOUNTER — Encounter (HOSPITAL_COMMUNITY): Payer: Self-pay

## 2017-07-12 ENCOUNTER — Other Ambulatory Visit: Payer: Self-pay

## 2017-07-12 DIAGNOSIS — R51 Headache: Secondary | ICD-10-CM | POA: Insufficient documentation

## 2017-07-12 DIAGNOSIS — F17210 Nicotine dependence, cigarettes, uncomplicated: Secondary | ICD-10-CM | POA: Insufficient documentation

## 2017-07-12 DIAGNOSIS — R519 Headache, unspecified: Secondary | ICD-10-CM

## 2017-07-12 LAB — I-STAT CHEM 8, ED
BUN: 9 mg/dL (ref 6–20)
Calcium, Ion: 1.16 mmol/L (ref 1.15–1.40)
Chloride: 105 mmol/L (ref 101–111)
Creatinine, Ser: 0.6 mg/dL (ref 0.44–1.00)
Glucose, Bld: 104 mg/dL — ABNORMAL HIGH (ref 65–99)
HEMATOCRIT: 34 % — AB (ref 36.0–46.0)
HEMOGLOBIN: 11.6 g/dL — AB (ref 12.0–15.0)
Potassium: 3.9 mmol/L (ref 3.5–5.1)
SODIUM: 141 mmol/L (ref 135–145)
TCO2: 25 mmol/L (ref 22–32)

## 2017-07-12 LAB — I-STAT BETA HCG BLOOD, ED (MC, WL, AP ONLY)

## 2017-07-12 LAB — I-STAT TROPONIN, ED: TROPONIN I, POC: 0 ng/mL (ref 0.00–0.08)

## 2017-07-12 MED ORDER — ACETAMINOPHEN 500 MG PO TABS
1000.0000 mg | ORAL_TABLET | Freq: Once | ORAL | Status: AC
  Administered 2017-07-12: 1000 mg via ORAL
  Filled 2017-07-12: qty 2

## 2017-07-12 MED ORDER — NAPROXEN 500 MG PO TABS
500.0000 mg | ORAL_TABLET | Freq: Once | ORAL | Status: AC
  Administered 2017-07-12: 500 mg via ORAL
  Filled 2017-07-12: qty 1

## 2017-07-12 NOTE — ED Notes (Signed)
Bed: WA08 Expected date:  Expected time:  Means of arrival:  Comments: 

## 2017-07-12 NOTE — ED Triage Notes (Signed)
States for about 6 days felt like head was hot and was checking blood pressure at home and bottom number at 102.

## 2017-07-12 NOTE — ED Provider Notes (Signed)
[facility]-EMERGENCY DEPT Provider Note   CSN: [id number] Arrival date & time: 07/12/17  0033     History   Chief Complaint Chief Complaint  Patient presents with  . Hypertension    HPI Elizabeth Burnett is a 42 y.o. female.  The history is provided by the patient.  Hypertension  This is a new problem. The current episode started more than 2 days ago. The problem occurs constantly. The problem has not changed since onset.Pertinent negatives include no chest pain, no abdominal pain and no shortness of breath. Nothing aggravates the symptoms. Nothing relieves the symptoms. She has tried nothing for the symptoms. The treatment provided no relief.  Mild frontal headache that started after she got to the ED . No weakness or numbness.  No changes in vision or speech.  No long car trips or plane trips no OCP.     History reviewed. No pertinent past medical history.  There are no active problems to display for this patient.   Past Surgical History:  Procedure Laterality Date  . APPENDECTOMY       OB History   None      Home Medications    Prior to Admission medications   Not on File    Family History History reviewed. No pertinent family history.  Social History Social History   Tobacco Use  . Smoking status: Current Some Day Smoker  . Smokeless tobacco: Never Used  Substance Use Topics  . Alcohol use: Never    Frequency: Never  . Drug use: Never     Allergies   Patient has no known allergies.   Review of Systems Review of Systems  Eyes: Negative for photophobia.  Respiratory: Negative for shortness of breath.   Cardiovascular: Negative for chest pain, palpitations and leg swelling.  Gastrointestinal: Negative for abdominal pain, nausea and vomiting.  Genitourinary: Negative for hematuria.  Neurological: Negative for dizziness, tremors, seizures, syncope, facial asymmetry, speech difficulty, light-headedness and numbness.  All other  systems reviewed and are negative.    Physical Exam Updated Vital Signs BP 132/90   Pulse (!) 59   Temp 97.7 F (36.5 C) (Oral)   Resp 18   Ht 5\' 1"  (1.549 m)   Wt 71.7 kg (158 lb)   SpO2 96%   BMI 29.85 kg/m   Physical Exam  Constitutional: She is oriented to person, place, and time. She appears well-developed and well-nourished. No distress.  HENT:  Head: Normocephalic and atraumatic.  Mouth/Throat: Oropharynx is clear and moist. No oropharyngeal exudate.  Eyes: Pupils are equal, round, and reactive to light. Conjunctivae and EOM are normal.  No proptosis intact cognition  Neck: Normal range of motion. Neck supple.  Cardiovascular: Normal rate, regular rhythm, normal heart sounds and intact distal pulses.  Pulmonary/Chest: Effort normal and breath sounds normal. No stridor. She has no wheezes. She has no rales.  Abdominal: Soft. Bowel sounds are normal. She exhibits no mass. There is no tenderness. There is no rebound and no guarding.  Musculoskeletal: Normal range of motion. She exhibits no edema or tenderness.  Neurological: She is alert and oriented to person, place, and time. She displays normal reflexes. No cranial nerve deficit. She exhibits normal muscle tone. Coordination normal.  Skin: Skin is warm and dry. Capillary refill takes less than 2 seconds.  Psychiatric: She has a normal mood and affect.  Nursing note and vitals reviewed.    ED Treatments / Results  Labs (all labs ordered are listed, but  only abnormal results are displayed) Labs Reviewed  I-STAT CHEM 8, ED - Abnormal; Notable for the following components:      Result Value   Glucose, Bld 104 (*)    Hemoglobin 11.6 (*)    HCT 34.0 (*)    All other components within normal limits  I-STAT TROPONIN, ED  I-STAT BETA HCG BLOOD, ED ([facility], [facility], [facility] ONLY)    EKG EKG Interpretation  Date/Time:  Sunday Raven Furnas 07 2019 01:08:03 EDT Ventricular Rate:  73 PR Interval:    QRS Duration: 94 QT  Interval:  407 QTC Calculation: 449 R Axis:   4 Text Interpretation:  Sinus rhythm Low voltage, precordial leads Confirmed by Nicanor Alcon, Alveria Mcglaughlin ([id number]) on 07/12/2017 4:58:44 AM   Radiology No results found.  Procedures Procedures (including critical care time)  Medications Ordered in ED Medications  acetaminophen (TYLENOL) tablet 1,000 mg (1,000 mg Oral Given 07/12/17 0519)  naproxen (NAPROSYN) tablet 500 mg (500 mg Oral Given 07/12/17 0519)     PERC negative wells 0 highly doubt PE  Final Clinical Impressions(s) / ED Diagnoses   No concerning symptoms for ICH.  BP is normal in the ED without intervention.  Keep BP log.  Will start DASH diet and have patient follow up with PMD for ongoing testing.    Return for weakness, numbness, changes in vision or speech, fevers >100.4 unrelieved by medication, shortness of breath, intractable vomiting, or diarrhea, abdominal pain, Inability to tolerate liquids or food, cough, altered mental status or any concerns. No signs of systemic illness or infection. The patient is nontoxic-appearing on exam and vital signs are within normal limits.   I have reviewed the triage vital signs and the nursing notes. Pertinent labs &imaging results that were available during my care of the patient were reviewed by me and considered in my medical decision making (see chart for details).  After history, exam, and medical workup I feel the patient has been appropriately medically screened and is safe for discharge home. Pertinent diagnoses were discussed with the patient. Patient was given return precautions.    Airiel Oblinger, MD 07/12/17 [phone]

## 2018-10-12 ENCOUNTER — Emergency Department (HOSPITAL_COMMUNITY)
Admission: EM | Admit: 2018-10-12 | Discharge: 2018-10-13 | Disposition: A | Discharge status: Home / Self Care | Payer: BC Managed Care – PPO | Attending: Emergency Medicine | Admitting: Emergency Medicine

## 2018-10-12 ENCOUNTER — Encounter (HOSPITAL_COMMUNITY): Payer: Self-pay | Admitting: Emergency Medicine

## 2018-10-12 ENCOUNTER — Emergency Department (HOSPITAL_COMMUNITY): Payer: BC Managed Care – PPO

## 2018-10-12 ENCOUNTER — Other Ambulatory Visit: Payer: Self-pay

## 2018-10-12 DIAGNOSIS — R0789 Other chest pain: Secondary | ICD-10-CM | POA: Insufficient documentation

## 2018-10-12 DIAGNOSIS — R0602 Shortness of breath: Secondary | ICD-10-CM | POA: Diagnosis not present

## 2018-10-12 DIAGNOSIS — R030 Elevated blood-pressure reading, without diagnosis of hypertension: Secondary | ICD-10-CM | POA: Diagnosis not present

## 2018-10-12 DIAGNOSIS — F17200 Nicotine dependence, unspecified, uncomplicated: Secondary | ICD-10-CM | POA: Diagnosis not present

## 2018-10-12 DIAGNOSIS — R079 Chest pain, unspecified: Secondary | ICD-10-CM

## 2018-10-12 DIAGNOSIS — R911 Solitary pulmonary nodule: Secondary | ICD-10-CM | POA: Diagnosis not present

## 2018-10-12 LAB — CBC
HCT: 40.7 % (ref 36.0–46.0)
Hemoglobin: 13.2 g/dL (ref 12.0–15.0)
MCH: 28.1 pg (ref 26.0–34.0)
MCHC: 32.4 g/dL (ref 30.0–36.0)
MCV: 86.6 fL (ref 80.0–100.0)
Platelets: 288 10*3/uL (ref 150–400)
RBC: 4.7 MIL/uL (ref 3.87–5.11)
RDW: 12.7 % (ref 11.5–15.5)
WBC: 11.6 10*3/uL — ABNORMAL HIGH (ref 4.0–10.5)
nRBC: 0 % (ref 0.0–0.2)

## 2018-10-12 LAB — BASIC METABOLIC PANEL
Anion gap: 10 (ref 5–15)
BUN: 13 mg/dL (ref 6–20)
CO2: 24 mmol/L (ref 22–32)
Calcium: 9.1 mg/dL (ref 8.9–10.3)
Chloride: 106 mmol/L (ref 98–111)
Creatinine, Ser: 0.71 mg/dL (ref 0.44–1.00)
GFR calc Af Amer: >60 mL/min (ref >60)
GFR calc non Af Amer: >60 mL/min (ref >60)
Glucose, Bld: 134 mg/dL — ABNORMAL HIGH (ref 70–99)
Potassium: 3.9 mmol/L (ref 3.5–5.1)
Sodium: 140 mmol/L (ref 135–145)

## 2018-10-12 LAB — TROPONIN I (HIGH SENSITIVITY)
Troponin I (High Sensitivity): 2 ng/L (ref <18)
Troponin I (High Sensitivity): 2 ng/L (ref <18)
Troponin I (High Sensitivity): 3 ng/L (ref <18)

## 2018-10-12 LAB — I-STAT BETA HCG BLOOD, ED (MC, WL, AP ONLY): I-stat hCG, quantitative: <5 m[IU]/mL (ref <5)

## 2018-10-12 LAB — D-DIMER, QUANTITATIVE: D-Dimer, Quant: 0.98 ug/mL-FEU — ABNORMAL HIGH (ref 0.00–0.50)

## 2018-10-12 MED ORDER — LIDOCAINE 5 % EX PTCH
1.0000 | MEDICATED_PATCH | CUTANEOUS | Status: DC
  Administered 2018-10-12: 1 via TRANSDERMAL
  Filled 2018-10-12: qty 1

## 2018-10-12 MED ORDER — METHOCARBAMOL 500 MG PO TABS
500.0000 mg | ORAL_TABLET | Freq: Once | ORAL | Status: AC
  Administered 2018-10-12: 23:00:00 500 mg via ORAL
  Filled 2018-10-12: qty 1

## 2018-10-12 MED ORDER — IOHEXOL 350 MG/ML SOLN
75.0000 mL | Freq: Once | INTRAVENOUS | Status: AC | PRN
  Administered 2018-10-12: 75 mL via INTRAVENOUS

## 2018-10-12 MED ORDER — ALUM & MAG HYDROXIDE-SIMETH 200-200-20 MG/5ML PO SUSP: 15.0000 mL | Freq: Once | ORAL | Status: DC

## 2018-10-12 MED ORDER — SODIUM CHLORIDE 0.9% FLUSH: 3.0000 mL | Freq: Once | INTRAVENOUS | Status: DC

## 2018-10-12 NOTE — ED Triage Notes (Signed)
Pt sent by PCP for recurrent chest pain that radiates to the left shoulder and shortness of breath. Pt tested for COVID, waiting for result. Denies cough/fever.

## 2018-10-12 NOTE — ED Provider Notes (Signed)
[facility] Provider Note   CSN: [id number] Arrival date & time: 10/12/18  1508     History   Chief Complaint Chief Complaint  Patient presents with   Chest Pain    HPI Elizabeth Burnett is a 43 y.o. female.     HPI   Patient is a 43 yo female with a PMH of hypertension not on antihypertensives, muscle spasms presenting for left-sided chest pain.  Patient ports that for approximately 3 days she has had sharp anterior left chest pain radiating to her left shoulder.  She reports it is constant.  Breathing will make it worse in addition to movement.  She reports that the skin is extremely sensitive to touch and she feels that just beneath her left breast is very tender.  She denies noting any rashes.  Patient reports some shortness of breath with this.  Patient reports that she has been chronically short of breath and her primary care provider has been working her up for this complaint.  She denies any history of fatigue with exertion, bulbar symptoms, or history of autoimmune disease.  Patient denies any personal history of cardiovascular disease.  She is a current some day smoker.  Patient reports that her grandfather had an MI and she is unsure at what age.  Her mother died at age 43, cause indeterminate but she did have hypertension.  Patient denies any history of DVT/PE, hormone use, cancer treatment, recent immobilization, hospitalization, recent surgery, lower extremity edema or calf tenderness, or hemoptysis.  History reviewed. No pertinent past medical history.  There are no active problems to display for this patient.   Past Surgical History:  Procedure Laterality Date   APPENDECTOMY       OB History   No obstetric history on file.      Home Medications    Prior to Admission medications   Not on File    Family History No family history on file.  Social History Social History   Tobacco Use   Smoking status: Current Some  Day Smoker   Smokeless tobacco: Never Used  Substance Use Topics   Alcohol use: Never    Frequency: Never   Drug use: Never     Allergies   Patient has no known allergies.   Review of Systems Review of Systems  Constitutional: Negative for chills and fever.  HENT: Negative for congestion and sore throat.   Eyes: Negative for visual disturbance.  Respiratory: Positive for chest tightness and shortness of breath. Negative for cough.   Cardiovascular: Negative for chest pain, palpitations and leg swelling.  Gastrointestinal: Negative for abdominal pain, nausea and vomiting.  Genitourinary: Negative for dysuria and flank pain.  Musculoskeletal: Negative for back pain and myalgias.  Skin: Negative for rash.  Neurological: Negative for dizziness, syncope, light-headedness and headaches.     Physical Exam Updated Vital Signs BP (!) 182/110    Pulse 95    Temp 98.4 F (36.9 C)    Resp (!) 29    Ht 5\' 1"  (1.549 m)    Wt 67.6 kg    LMP 09/23/2018    SpO2 100%    BMI 28.15 kg/m   Physical Exam Vitals signs and nursing note reviewed.  Constitutional:      General: She is not in acute distress.    Appearance: She is well-developed.  HENT:     Head: Normocephalic and atraumatic.  Eyes:     Conjunctiva/sclera: Conjunctivae normal.  Pupils: Pupils are equal, round, and reactive to light.  Neck:     Musculoskeletal: Normal range of motion and neck supple.  Cardiovascular:     Rate and Rhythm: Normal rate and regular rhythm.     Pulses:          Radial pulses are 2+ on the right side and 2+ on the left side.       Dorsalis pedis pulses are 2+ on the right side and 2+ on the left side.     Heart sounds: S1 normal and S2 normal. No murmur.  Pulmonary:     Effort: Pulmonary effort is normal.     Breath sounds: Normal breath sounds. No wheezing or rales.  Chest:     Chest wall: Tenderness present.     Comments: Patient is exquisite tenderness to light palpation of the skin  underlying the left breast and to palpation of the left pectoral muscle.  Abdominal:     General: There is no distension.     Palpations: Abdomen is soft.     Tenderness: There is no abdominal tenderness. There is no guarding.  Musculoskeletal: Normal range of motion.        General: No deformity.  Lymphadenopathy:     Cervical: No cervical adenopathy.  Skin:    General: Skin is warm and dry.     Findings: No erythema or rash.  Neurological:     Mental Status: She is alert.     Comments: Cranial nerves grossly intact. Patient moves extremities symmetrically and with good coordination.  Psychiatric:        Behavior: Behavior normal.        Thought Content: Thought content normal.        Judgment: Judgment normal.      ED Treatments / Results  Labs (all labs ordered are listed, but only abnormal results are displayed) Labs Reviewed  BASIC METABOLIC PANEL - Abnormal; Notable for the following components:      Result Value   Glucose, Bld 134 (*)    All other components within normal limits  CBC - Abnormal; Notable for the following components:   WBC 11.6 (*)    All other components within normal limits  D-DIMER, QUANTITATIVE (NOT AT [facility]) - Abnormal; Notable for the following components:   D-Dimer, Quant 0.98 (*)    All other components within normal limits  TROPONIN I (HIGH SENSITIVITY)  TROPONIN I (HIGH SENSITIVITY)  TROPONIN I (HIGH SENSITIVITY)  TROPONIN I (HIGH SENSITIVITY)  I-STAT BETA HCG BLOOD, ED ([facility], [facility], [facility] ONLY)    EKG EKG Interpretation  Date/Time:  Tuesday October 12 2018 15:21:51 EDT Ventricular Rate:  100 PR Interval:  144 QRS Duration: 78 QT Interval:  358 QTC Calculation: 461 R Axis:   12 Text Interpretation:  Normal sinus rhythm Low voltage QRS Borderline ECG Confirmed by Kristine RoyalMessick, Peter [phone]([id number]) on 10/12/2018 10:59:48 PM   Radiology Dg Chest 2 View  Result Date: 10/12/2018 CLINICAL DATA:  Recurrent chest pain. EXAM: CHEST - 2 VIEW COMPARISON:   07/21/2016 FINDINGS: Cardiomediastinal silhouette is normal. Mediastinal contours appear intact. No pleural effusion or pneumothorax. Bibasilar atelectasis versus airspace consolidation. Osseous structures are without acute abnormality. Soft tissues are grossly normal. IMPRESSION: Bibasilar atelectasis versus airspace consolidation. Electronically Signed   By: Ted Mcalpineobrinka  Dimitrova M.D.   On: 10/12/2018 16:01   Ct Angio Chest Pe W/cm &/or Wo Cm  Result Date: 10/12/2018 CLINICAL DATA:  Recurrent chest pain radiating to left shoulder. EXAM: CT  ANGIOGRAPHY CHEST WITH CONTRAST TECHNIQUE: Multidetector CT imaging of the chest was performed using the standard protocol during bolus administration of intravenous contrast. Multiplanar CT image reconstructions and MIPs were obtained to evaluate the vascular anatomy. CONTRAST:  47mL OMNIPAQUE IOHEXOL 350 MG/ML SOLN COMPARISON:  None. FINDINGS: Cardiovascular: Contrast injection is sufficient to demonstrate satisfactory opacification of the pulmonary arteries to the segmental level. There is no pulmonary embolus. The main pulmonary artery is within normal limits for size. There is no CT evidence of acute right heart strain. The visualized aorta is normal. Heart size is normal, without pericardial effusion. Mediastinum/Nodes: --No mediastinal or hilar lymphadenopathy. --No axillary lymphadenopathy. --No supraclavicular lymphadenopathy. --Normal thyroid gland. --The esophagus is unremarkable Lungs/Pleura: There is bibasilar atelectasis. The lung volumes are somewhat low. There is a 4 mm pulmonary nodule in the lingula (axial series 6, image 64). There is no pneumothorax. No large pleural effusion. Upper Abdomen: No acute abnormality. There is probable hepatic steatosis. Musculoskeletal: No chest wall abnormality. No acute or significant osseous findings. Review of the MIP images confirms the above findings. IMPRESSION: 1. No PE identified. 2. Low lung volumes with bibasilar  atelectasis. 3. 4 mm pulmonary nodule in the lingula as detailed above. No follow-up needed if patient is low-risk. Non-contrast chest CT can be considered in 12 months if patient is high-risk. This recommendation follows the consensus statement: Guidelines for Management of Incidental Pulmonary Nodules Detected on CT Images: From the Fleischner Society 2017; Radiology 2017; 284:228-243. 4. Probable hepatic steatosis. Electronically Signed   By: Constance Holster M.D.   On: 10/12/2018 23:39    Procedures Procedures (including critical care time)  Medications Ordered in ED Medications  sodium chloride flush (NS) 0.9 % injection 3 mL (0 mLs Intravenous Hold 10/12/18 2335)  lidocaine (LIDODERM) 5 % 1 patch (1 patch Transdermal Patch Applied 10/12/18 2243)  methocarbamol (ROBAXIN) tablet 500 mg (500 mg Oral Given 10/12/18 2243)  iohexol (OMNIPAQUE) 350 MG/ML injection 75 mL (75 mLs Intravenous Contrast Given 10/12/18 2321)     Initial Impression / Assessment and Plan / ED Course  I have reviewed the triage vital signs and the nursing notes.  Pertinent labs & imaging results that were available during my care of the patient were reviewed by me and considered in my medical decision making (see chart for details).  Clinical Course as of Oct 12 2351  Tue Oct 12, 2018  2301 D-Dimer, Quant(!): 0.98 [AM]    Clinical Course User Index [AM] Albesa Seen, PA-C       Differential diagnosis includes ACS, PE, thoracic aortic dissection, Boerhaave's syndrome, cardiac tamponade, pneumothorax, incarcerated diaphragmatic hernia, cholecystitis, esophageal spasm, gastroesophageal reflux, herpes zoster of the thorax, pericarditis, pneumonia, pyelonephritis, chest wall pain, costochondritis.   Doubt ACS, as delta troponin is negative, initial and repeat EKGs show no signs of ischemia, infarction, or arrhythmia, and HEART score 1 (risk factors). Doubt PE as patient had a negative CTPA. Doubt TAD by hx, CXR showed  no widening mediastinum, and pulses equal in all extremities. Patient remained nontoxic appearing and in no acute distress during emergency department course, however continued to complain of chest wall pain. Vital signs stable with exception of tachypnea in the emergency department. PE ruled out and no pneumomediastinum or significant mediastinal lymphadenopathy present on CT. Pericarditis less likely due to no preceding infectious symptoms but pain is improved in upright positions.  EKG also not demonstrative of any of the signs of pericarditis.  Abnormal labs include slight leukocytosis of  11.6.  I also discussed with the patient the possibility of a zoster rash developing and that she should present for antiviral treatment if she does notice a rash.  We will treat patient with muscle relaxants and NSAIDs.  I stated that she needs to follow-up with point with her primary care provider in 2 days for recheck.  No diagnostic signs of pericarditis at this time however will start patient on NSAIDs for chest wall pain as well as muscle relaxants.  She is given return precautions for any worsening pain, shortness of breath, dizziness, lightheadedness, or any new or worsening symptoms.  Patient is in understanding and agrees with the plan of care.  This is a supervised visit with Dr. Kristine RoyalPeter Messick. Evaluation, management, and discharge planning discussed with this attending physician.  Final Clinical Impressions(s) / ED Diagnoses   Final diagnoses:  Left-sided chest pain  Pulmonary nodule  Elevated blood pressure reading    ED Discharge Orders    None       Delia ChimesMurray, Rylan Kaufmann B, PA-C 10/13/18 0145    Nira Connardama, Pedro Eduardo, MD 10/13/18 [phone]

## 2018-10-13 MED ORDER — IBUPROFEN 600 MG PO TABS: 600.0000 mg | ORAL_TABLET | Freq: Three times a day (TID) | ORAL | 0 refills | Status: AC

## 2018-10-13 MED ORDER — METHOCARBAMOL 500 MG PO TABS: 500.0000 mg | ORAL_TABLET | Freq: Two times a day (BID) | ORAL | 0 refills | Status: DC

## 2018-10-13 MED ORDER — KETOROLAC TROMETHAMINE 30 MG/ML IJ SOLN
30.0000 mg | Freq: Once | INTRAMUSCULAR | Status: AC
  Administered 2018-10-13: 30 mg via INTRAVENOUS
  Filled 2018-10-13: qty 1

## 2018-10-13 NOTE — ED Notes (Signed)
Pt discharged from ED; instructions provided and scripts given; Pt encouraged to return to ED if symptoms worsen and to f/u with PCP; Pt verbalized understanding of all instructions 

## 2018-10-13 NOTE — Discharge Instructions (Signed)
Please read and follow all provided instructions.  Your diagnoses today include:  1. Left-sided chest pain   2. Pulmonary nodule   3. Elevated blood pressure reading     Tests performed today include: An EKG of your heart A chest x-ray Cardiac enzymes - a blood test for heart muscle damage Blood counts and electrolytes Vital signs. See below for your results today.   Medications prescribed:   Take any prescribed medications only as directed.   You are prescribed Robaxin, a muscle relaxant. Some common side effects of this medication include:  Feeling sleepy.  Dizziness. Take care upon going from a seated to a standing position.  Dry mouth.  Feeling tired or weak.  Hard stools (constipation).  Upset stomach. These are not all of the side effects that may occur. If you have questions about side effects, call your doctor. Call your primary care provider for medical advice about side effects.  This medication can be sedating. Only take this medication as needed. Please do not combine with alcohol. Do not drive or operate machinery while taking this medication.   This medication can interact with some other medications. Make sure to tell any provider you are taking this medication before they prescribe you a new medication.   You are prescribed ibuprofen, a non-steroidal anti-inflammatory agent (NSAID) for pain. You may take 600 mg every 8 hours. Please schedule this around the clock. If still requiring this medication around the clock for acute pain after 10 days, please see your primary healthcare provider.  Women who are pregnant, breastfeeding, or planning on becoming pregnant should not take non-steroidal anti-inflammatories such as Advil and Aleve. Tylenol is a safe over the counter pain reliever in pregnant women.  You may combine this medication with Tylenol, 650 mg every 6 hours, so you are receiving something for pain every 3 hours.  This is not a long-term medication  unless under the care and direction of your primary provider. Taking this medication long-term and not under the supervision of a healthcare provider could increase the risk of stomach ulcers, kidney problems, and cardiovascular problems such as high blood pressure.   Follow-up instructions: Please follow-up with your primary care provider as soon as you can for further evaluation of your symptoms.   Return instructions:  SEEK IMMEDIATE MEDICAL ATTENTION IF: You have severe chest pain, especially if the pain is crushing or pressure-like and spreads to the arms, back, neck, or jaw, or if you have sweating, nausea (feeling sick to your stomach), or shortness of breath. THIS IS AN EMERGENCY. Don't wait to see if the pain will go away. Get medical help at once. Call 911 or 0 (operator). DO NOT drive yourself to the hospital.  Your chest pain gets worse and does not go away with rest.  You have an attack of chest pain lasting longer than usual, despite rest and treatment with the medications your caregiver has prescribed.  You wake from sleep with chest pain or shortness of breath. You feel dizzy or faint. You have chest pain not typical of your usual pain for which you originally saw your caregiver.  You have any other emergent concerns regarding your health.  Additional Information: Chest pain comes from many different causes. Your caregiver has diagnosed you as having chest pain that is not specific for one problem, but does not require admission.  You are at low risk for an acute heart condition or other serious illness.   Your vital signs today were:  BP (!) 182/110    Pulse 95    Temp 98.4 F (36.9 C)    Resp (!) 29    Ht 5\' 1"  (1.549 m)    Wt 67.6 kg    LMP 09/23/2018    SpO2 100%    BMI 28.15 kg/m  If your blood pressure (BP) was elevated above 135/85 this visit, please have this repeated by your doctor within one month. --------------

## 2018-10-21 ENCOUNTER — Ambulatory Visit: Payer: Self-pay | Admitting: Cardiology

## 2018-10-31 NOTE — Progress Notes (Signed)
Cardiology Office Note:    Date:  11/01/2018   ID:  Elizabeth Burnett, DOB 1975-08-18, MRN [id number]  PCP:  Dema SeverinYork, Regina F, NP  Cardiologist:  Norman HerrlichBrian Amarra Sawyer, MD   Referring MD: Dema SeverinYork, Regina F, NP  ASSESSMENT:    1. Essential hypertension   2. Chest pain in adult   3. Hepatic steatosis   4. Screening cholesterol level    PLAN:    In order of problems listed above:  She has significant hypertension stage II start treatment ARB thiazide diuretic 1 week check BMP with hepatic steatosis check lipids lipoprotein a level.  She will continue sodium restriction check blood pressure twice daily. She had typical idiopathic pleuritis resolved with nonsteroidal anti-inflammatory drug I do not think she needs an ischemia evaluation She was unaware of this it was noted on the CT scan I encouraged her to see a gastroenterologist and she tells me she has a history of ulcerative colitis  Next appointment in 4 weeks  Medication Adjustments/Labs and Tests Ordered: Current medicines are reviewed at length with the patient today.  Concerns regarding medicines are outlined above.  Orders Placed This Encounter  Procedures  . Lipid Profile  . Lipoprotein A (LPA)  . Basic Metabolic Panel (BMET)   Meds ordered this encounter  Medications  . telmisartan-hydrochlorothiazide (MICARDIS HCT) 40-12.5 MG tablet    Sig: Take 1 tablet by mouth daily.    Dispense:  30 tablet    Refill:  3     Chief Complaint  Patient presents with  . Hypertension    History of Present Illness:    Elizabeth Burnett is a 43 y.o. female who is being seen today for elevated blood pressure and the evaluation of pleuritic chest pain and SOB [facility] 0707/2020 at the request of the patient.  Her ED evaluation showed a normal EKG HEART score of 1 and stable troponin she underwent CTA of the chest with elevated d-dimer excluding pulmonary embolus.  She was treated with a nonsteroidal anti-inflammatory drug.   Although she was in the emergency room this is prompted by high blood pressure she said after she took a nonsteroidal anti-inflammatory drug her chest pain resolved and has not recurred.  Home blood pressures around systolic 150-170 diastolic 110 120.  She has a strong family history restrict sodium and have been on a beta-blocker with continued elevation.  We will start an ARB thiazide diuretic check renal function in 1 week and follow-up in the office in 4 weeks.  I reinforced the need for sodium restriction home self-management and activity.  No chest pain edema palpitation no history of congenital rheumatic heart disease. Past Medical History:  Diagnosis Date  . Ulcerative colitis [facility](HCC)     Past Surgical History:  Procedure Laterality Date  . ACHILLES TENDON SURGERY Right   . APPENDECTOMY      Current Medications: No outpatient medications have been marked as taking for the 11/01/18 encounter (Office Visit) with Baldo DaubMunley, Oiva Dibari J, MD.     Allergies:   Patient has no known allergies.   Social History   Socioeconomic History  . Marital status: Unknown    Spouse name: Not on file  . Number of children: Not on file  . Years of education: Not on file  . Highest education level: Not on file  Occupational History  . Not on file  Social Needs  . Financial resource strain: Not on file  . Food insecurity    Worry:  Not on file    Inability: Not on file  . Transportation needs    Medical: Not on file    Non-medical: Not on file  Tobacco Use  . Smoking status: Current Some Day Smoker    Types: Cigarettes  . Smokeless tobacco: Never Used  . Tobacco comment: social smoker  Substance and Sexual Activity  . Alcohol use: Never    Frequency: Never  . Drug use: Never  . Sexual activity: Not on file  Lifestyle  . Physical activity    Days per week: Not on file    Minutes per session: Not on file  . Stress: Not on file  Relationships  . Social Herbalist on phone: Not on  file    Gets together: Not on file    Attends religious service: Not on file    Active member of club or organization: Not on file    Attends meetings of clubs or organizations: Not on file    Relationship status: Not on file  Other Topics Concern  . Not on file  Social History Narrative  . Not on file     Family History: The patient's family history includes Aneurysm in her maternal grandfather; Asthma in her mother; Diabetes in her father; Heart attack in her maternal grandmother; Heart disease in her maternal grandfather; Hypertension in her father, maternal grandmother, and mother; Stroke in her maternal grandfather.  ROS:   Review of Systems  Constitution: Positive for malaise/fatigue.  HENT: Negative.   Eyes: Negative.   Cardiovascular: Positive for chest pain.  Respiratory: Positive for shortness of breath.   Endocrine: Negative.   Hematologic/Lymphatic: Negative.   Skin: Negative.   Musculoskeletal: Positive for myalgias.  Gastrointestinal: Negative.   Genitourinary: Negative.   Neurological: Negative.   Psychiatric/Behavioral: Negative.   Allergic/Immunologic: Negative.    Please see the history of present illness.     All other systems reviewed and are negative.  EKGs/Labs/Other Studies Reviewed:    The following studies were reviewed today:   CTA of chest, IMPRESSION: 1. No PE identified. 2. Low lung volumes with bibasilar atelectasis. 3. 4 mm pulmonary nodule in the lingula as detailed above. No follow-up needed if patient is low-risk. Non-contrast chest CT can be considered in 12 months if patient is high-risk. This recommendation follows the consensus statement: Guidelines for Management of Incidental Pulmonary Nodules Detected on CT Images: From the Fleischner Society 2017; Radiology 2017; 284:228-243. 4. Probable hepatic steatosis.  Recent Labs:  2wk ago (10/12/18) 2wk ago (10/12/18) 2wk ago (10/12/18)    Troponin I (High Sensitivity) <18 ng/L 3  2 CM   2 CM     Ref Range & Units 2wk ago 20yr ago 77yr ago  WBC 4.0 - 10.5 K/uL 11.6High    7.1      10/12/2018: BUN 13; Creatinine, Ser 0.71; Hemoglobin 13.2; Platelets 288; Potassium 3.9; Sodium 140  Recent Lipid Panel No results found for: CHOL, TRIG, HDL, CHOLHDL, VLDL, LDLCALC, LDLDIRECT  Physical Exam:    VS:  BP (!) 138/98 (BP Location: Right Arm, Patient Position: Sitting, Cuff Size: Normal)   Pulse 90   Temp 98.5 F (36.9 C)   Ht 5\' 1"  (1.549 m)   Wt 149 lb 9.6 oz (67.9 kg)   SpO2 97%   BMI 28.27 kg/m     Wt Readings from Last 3 Encounters:  11/01/18 149 lb 9.6 oz (67.9 kg)  10/12/18 149 lb (67.6 kg)  07/12/17 158 lb (  71.7 kg)     GEN:  Well nourished, well developed in no acute distress HEENT: Normal NECK: No JVD; No carotid bruits LYMPHATICS: No lymphadenopathy CARDIAC: RRR, no murmurs, rubs, gallops RESPIRATORY:  Clear to auscultation without rales, wheezing or rhonchi  ABDOMEN: Soft, non-tender, non-distended MUSCULOSKELETAL:  No edema; No deformity  SKIN: Warm and dry NEUROLOGIC:  Alert and oriented x 3 PSYCHIATRIC:  Normal affect     Signed, Norman HerrlichBrian Tene Gato, MD  11/01/2018 4:09 PM    [facility]

## 2018-11-01 ENCOUNTER — Ambulatory Visit (INDEPENDENT_AMBULATORY_CARE_PROVIDER_SITE_OTHER): Payer: BC Managed Care – PPO | Admitting: Cardiology

## 2018-11-01 ENCOUNTER — Other Ambulatory Visit: Payer: Self-pay

## 2018-11-01 ENCOUNTER — Encounter: Payer: Self-pay | Admitting: Cardiology

## 2018-11-01 VITALS — BP 138/98 | HR 90 | Temp 98.5°F | Ht 61.0 in | Wt 149.6 lb

## 2018-11-01 DIAGNOSIS — Z13220 Encounter for screening for lipoid disorders: Secondary | ICD-10-CM

## 2018-11-01 DIAGNOSIS — R079 Chest pain, unspecified: Secondary | ICD-10-CM | POA: Insufficient documentation

## 2018-11-01 DIAGNOSIS — I10 Essential (primary) hypertension: Secondary | ICD-10-CM

## 2018-11-01 DIAGNOSIS — K760 Fatty (change of) liver, not elsewhere classified: Secondary | ICD-10-CM | POA: Diagnosis not present

## 2018-11-01 HISTORY — DX: Chest pain, unspecified: R07.9

## 2018-11-01 MED ORDER — TELMISARTAN-HCTZ 40-12.5 MG PO TABS: 1.0000 | ORAL_TABLET | Freq: Every day | ORAL | 3 refills | Status: DC

## 2018-11-01 NOTE — Patient Instructions (Addendum)
Medication Instructions:  Your physician has recommended you make the following change in your medication:   START telmisartan-hydrochlorothiazide (micardis-HCTZ) 40-12.5 mg: Take 1 tablet daily  If you need a refill on your cardiac medications before your next appointment, please call your pharmacy.   Lab work: Your physician recommends that you return for lab work today: BMP, lipid profile, lipoprotein A (LPa).   If you have labs (blood work) drawn today and your tests are completely normal, you will receive your results only by: Marland Kitchen. MyChart Message (if you have MyChart) OR . A paper copy in the mail If you have any lab test that is abnormal or we need to change your treatment, we will call you to review the results.  Testing/Procedures: None  Follow-Up: At [facility], you and your health needs are our priority.  As part of our continuing mission to provide you with exceptional heart care, we have created designated Provider Care Teams.  These Care Teams include your primary Cardiologist (physician) and Advanced Practice Providers (APPs -  Physician Assistants and Nurse Practitioners) who all work together to provide you with the care you need, when you need it. . You will need a follow up appointment in 6 weeks.   Any Other Special Instructions Will Be Listed Below (If Applicable). **Please check your blood pressure daily at the same time every day and keep a log of these readings. Call our office in 2 weeks to inform us of your BP readings for Dr. Dulce SellarMunley to review.      Hydrochlorothiazide, HCTZ; Telmisartan tablets What is this medicine? TELMISARTAN; HYDROCHLOROTHIAZIDE (tel mi SAR tan; hye droe klor oh THYE a zide) is a combination of a drug that relaxes blood vessels and a diuretic. It is used to treat high blood pressure. This medicine may be used for other purposes; ask your health care provider or pharmacist if you have questions. COMMON BRAND NAME(S): Micardis HCT What  should I tell my health care provider before I take this medicine? They need to know if you have any of these conditions:  decreased urine  diabetes  if you are on a special diet, like a low-salt diet  immune system problems, like lupus  kidney disease  liver disease  an unusual or allergic reaction to telmisartan, hydrochlorothiazide, sulfa drugs, other medicines, foods, dyes, or preservatives  pregnant or trying to get pregnant  breast-feeding How should I use this medicine? Take this medicine by mouth with a drink of water. Follow the directions on the prescription label. You can take it with or without food. If it upsets your stomach, take it with food. Take your medicine at regular intervals. Do not take it more often than directed. Do not stop taking except on your doctor's advice. Talk to your pediatrician regarding the use of this medicine in children. Special care may be needed. Overdosage: If you think you have taken too much of this medicine contact a poison control center or emergency room at once. NOTE: This medicine is only for you. Do not share this medicine with others. What if I miss a dose? If you miss a dose, take it as soon as you can. If it is almost time for your next dose, take only that dose. Do not take double or extra doses. What may interact with this medicine?  barbiturates, like phenobarbital  blood pressure medicines  corticosteroids  diabetic medicines  digoxin  diuretics, especially triamterene, spironolactone or amiloride  lithium  NSAIDs, medicines for pain  and inflammation, like ibuprofen or naproxen  potassium salts or potassium supplements  prescription pain medicines  skeletal muscle relaxants like tubocurarine  some cholesterol lowering medicines like cholestyramine or colestipol  warfarin This list may not describe all possible interactions. Give your health care provider a list of all the medicines, herbs, non-prescription  drugs, or dietary supplements you use. Also tell them if you smoke, drink alcohol, or use illegal drugs. Some items may interact with your medicine. What should I watch for while using this medicine? Check your blood pressure regularly while you are taking this medicine. Ask your doctor or health care professional what your blood pressure should be and when you should contact him or her. When you check your blood pressure, write down the measurements to show your doctor or health care professional. If you are taking this medicine for a long time, you must visit your health care professional for regular checks on your progress. Make sure you schedule appointments on a regular basis. You must not get dehydrated. Ask your doctor or health care professional how much fluid you need to drink a day. Check with him or her if you get an attack of severe diarrhea, nausea and vomiting, or if you sweat a lot. The loss of too much body fluid can make it dangerous for you to take this medicine. Women should inform their doctor if they wish to become pregnant or think they might be pregnant. There is a potential for serious side effects to an unborn child, particularly in the second or third trimester. Talk to your health care professional or pharmacist for more information. You may get drowsy or dizzy. Do not drive, use machinery, or do anything that needs mental alertness until you know how this drug affects you. Do not stand or sit up quickly, especially if you are an older patient. This reduces the risk of dizzy or fainting spells. Alcohol can make you more drowsy and dizzy. Avoid alcoholic drinks. This medicine may increase blood sugar. Ask your healthcare provider if changes in diet or medicines are needed if you have diabetes. Avoid salt substitutes unless you are told otherwise by your doctor or health care professional. Do not treat yourself for coughs, colds, or pain while you are taking this medicine without  asking your doctor or health care professional for advice. Some ingredients may increase your blood pressure. This medicine can make you more sensitive to the sun. Keep out of the sun. If you cannot avoid being in the sun, wear protective clothing and use sunscreen. Do not use sun lamps or tanning beds/booths. What side effects may I notice from receiving this medicine? Side effects that you should report to your doctor or health care professional as soon as possible:  allergic reactions like skin rash, itching or hives, swelling of the face, lips, or tongue  breathing problems  changes in vision  dark urine  eye pain  fast or irregular heart beat, palpitations, or chest pain  feeling faint or lightheaded  muscle cramps  persistent dry cough  redness, blistering, peeling or loosening of the skin, including inside the mouth   signs and symptoms of high blood sugar such as being more thirsty or hungry or having to urinate more than normal. You may also feel very tired or have blurry vision.  stomach pain  trouble passing urine  unusual bleeding or bruising  worsened gout pain  yellowing of the eyes or skin Side effects that usually do not require medical  attention (report to your doctor or health care professional if they continue or are bothersome):  change in sex drive or performance  headache This list may not describe all possible side effects. Call your doctor for medical advice about side effects. You may report side effects to FDA at 1-800-FDA-1088. Where should I keep my medicine? Keep out of the reach of children. Store at room temperature between 15 and 30 degrees C (59 and 86 degrees F). Tablets should not be removed from the blisters until right before use. Throw away any unused medicine after the expiration date. NOTE: This sheet is a summary. It may not cover all possible information. If you have questions about this medicine, talk to your doctor, pharmacist,  or health care provider.  2020 Elsevier/Gold Standard (2018-01-13 10:15:51)

## 2018-11-02 LAB — BASIC METABOLIC PANEL
BUN/Creatinine Ratio: 18 (ref 9–23)
BUN: 12 mg/dL (ref 6–24)
CO2: 23 mmol/L (ref 20–29)
Calcium: 9.2 mg/dL (ref 8.7–10.2)
Chloride: 102 mmol/L (ref 96–106)
Creatinine, Ser: 0.66 mg/dL (ref 0.57–1.00)
GFR calc Af Amer: 126 mL/min/{1.73_m2} (ref >59)
GFR calc non Af Amer: 109 mL/min/{1.73_m2} (ref >59)
Glucose: 79 mg/dL (ref 65–99)
Potassium: 3.9 mmol/L (ref 3.5–5.2)
Sodium: 138 mmol/L (ref 134–144)

## 2018-11-02 LAB — LIPID PANEL
Chol/HDL Ratio: 4.2 ratio (ref 0.0–4.4)
Cholesterol, Total: 184 mg/dL (ref 100–199)
HDL: 44 mg/dL (ref >39)
LDL Calculated: 117 mg/dL — ABNORMAL HIGH (ref 0–99)
Triglycerides: 116 mg/dL (ref 0–149)
VLDL Cholesterol Cal: 23 mg/dL (ref 5–40)

## 2018-11-02 LAB — LIPOPROTEIN A (LPA): Lipoprotein (a): 52.3 nmol/L (ref <75.0)

## 2018-11-03 ENCOUNTER — Telehealth: Payer: Self-pay | Admitting: *Deleted

## 2018-11-03 MED ORDER — ROSUVASTATIN CALCIUM 10 MG PO TABS: 10.0000 mg | ORAL_TABLET | Freq: Every day | ORAL | 3 refills | Status: DC

## 2018-11-03 NOTE — Telephone Encounter (Signed)
Informed patient of lab results and advised to start rosuvastatin 10 mg daily. Patient is agreeable and verbalized understanding. Prescription has been sent to [facility] in [place] as requested. No further questions.

## 2018-11-03 NOTE — Telephone Encounter (Signed)
-----   Message from Richardo Priest, MD sent at 11/02/2018  4:58 PM EDT ----- Lipids good but elevated LPa start rosuvastatin 10 md day

## 2018-12-09 NOTE — Progress Notes (Deleted)
Cardiology Office Note:    Date:  12/09/2018   ID:  Elizabeth Burnett, DOB 19-Apr-1975, MRN [id number]  PCP:  Imagene Riches, NP  Cardiologist:  Shirlee More, MD    Referring MD: Imagene Riches, NP    ASSESSMENT:    No diagnosis found. PLAN:    In order of problems listed above:  1. ***   Next appointment: ***   Medication Adjustments/Labs and Tests Ordered: Current medicines are reviewed at length with the patient today.  Concerns regarding medicines are outlined above.  No orders of the defined types were placed in this encounter.  No orders of the defined types were placed in this encounter.   No chief complaint on file.   History of Present Illness:    Elizabeth Burnett is a 43 y.o. female with a hx of hypertension and idiopathic pleuritis last seen 11/01/2018. Compliance with diet, lifestyle and medications: ***  Lpa level: Ref Range & Units 83mo ago   Lipoprotein (a) <75.0 nmol/L 52.3     Past Medical History:  Diagnosis Date  . Ulcerative colitis [facility])     Past Surgical History:  Procedure Laterality Date  . ACHILLES TENDON SURGERY Right   . APPENDECTOMY      Current Medications: No outpatient medications have been marked as taking for the 12/10/18 encounter (Appointment) with Richardo Priest, MD.     Allergies:   Patient has no known allergies.   Social History   Socioeconomic History  . Marital status: Unknown    Spouse name: Not on file  . Number of children: Not on file  . Years of education: Not on file  . Highest education level: Not on file  Occupational History  . Not on file  Social Needs  . Financial resource strain: Not on file  . Food insecurity    Worry: Not on file    Inability: Not on file  . Transportation needs    Medical: Not on file    Non-medical: Not on file  Tobacco Use  . Smoking status: Current Some Day Smoker    Types: Cigarettes  . Smokeless tobacco: Never Used  . Tobacco comment: social smoker  Substance and  Sexual Activity  . Alcohol use: Never    Frequency: Never  . Drug use: Never  . Sexual activity: Not on file  Lifestyle  . Physical activity    Days per week: Not on file    Minutes per session: Not on file  . Stress: Not on file  Relationships  . Social Herbalist on phone: Not on file    Gets together: Not on file    Attends religious service: Not on file    Active member of club or organization: Not on file    Attends meetings of clubs or organizations: Not on file    Relationship status: Not on file  Other Topics Concern  . Not on file  Social History Narrative  . Not on file     Family History: The patient's ***family history includes Aneurysm in her maternal grandfather; Asthma in her mother; Diabetes in her father; Heart attack in her maternal grandmother; Heart disease in her maternal grandfather; Hypertension in her father, maternal grandmother, and mother; Stroke in her maternal grandfather. ROS:   Please see the history of present illness.    All other systems reviewed and are negative.  EKGs/Labs/Other Studies Reviewed:    The following studies were reviewed today:  EKG:  EKG ordered today and personally reviewed.  The ekg ordered today demonstrates ***  Recent Labs: 10/12/2018: Hemoglobin 13.2; Platelets 288 11/01/2018: BUN 12; Creatinine, Ser 0.66; Potassium 3.9; Sodium 138  Recent Lipid Panel    Component Value Date/Time   CHOL 184 11/01/2018 1622   TRIG 116 11/01/2018 1622   HDL 44 11/01/2018 1622   CHOLHDL 4.2 11/01/2018 1622   LDLCALC 117 (H) 11/01/2018 1622    Physical Exam:    VS:  There were no vitals taken for this visit.    Wt Readings from Last 3 Encounters:  11/01/18 149 lb 9.6 oz (67.9 kg)  10/12/18 149 lb (67.6 kg)  07/12/17 158 lb (71.7 kg)     GEN: *** Well nourished, well developed in no acute distress HEENT: Normal NECK: No JVD; No carotid bruits LYMPHATICS: No lymphadenopathy CARDIAC: ***RRR, no murmurs, rubs,  gallops RESPIRATORY:  Clear to auscultation without rales, wheezing or rhonchi  ABDOMEN: Soft, non-tender, non-distended MUSCULOSKELETAL:  No edema; No deformity  SKIN: Warm and dry NEUROLOGIC:  Alert and oriented x 3 PSYCHIATRIC:  Normal affect    Signed, Norman HerrlichBrian Munley, MD  12/09/2018 5:54 PM    [facility]

## 2018-12-10 ENCOUNTER — Ambulatory Visit: Payer: BC Managed Care – PPO | Admitting: Cardiology

## 2021-06-24 IMAGING — DX CHEST - 2 VIEW
2 series · 2 of 2 positions shown · non-contrast
Comparison: 07/21/2016

CLINICAL DATA: Recurrent chest pain.

EXAM:
CHEST - 2 VIEW

[chest pa]
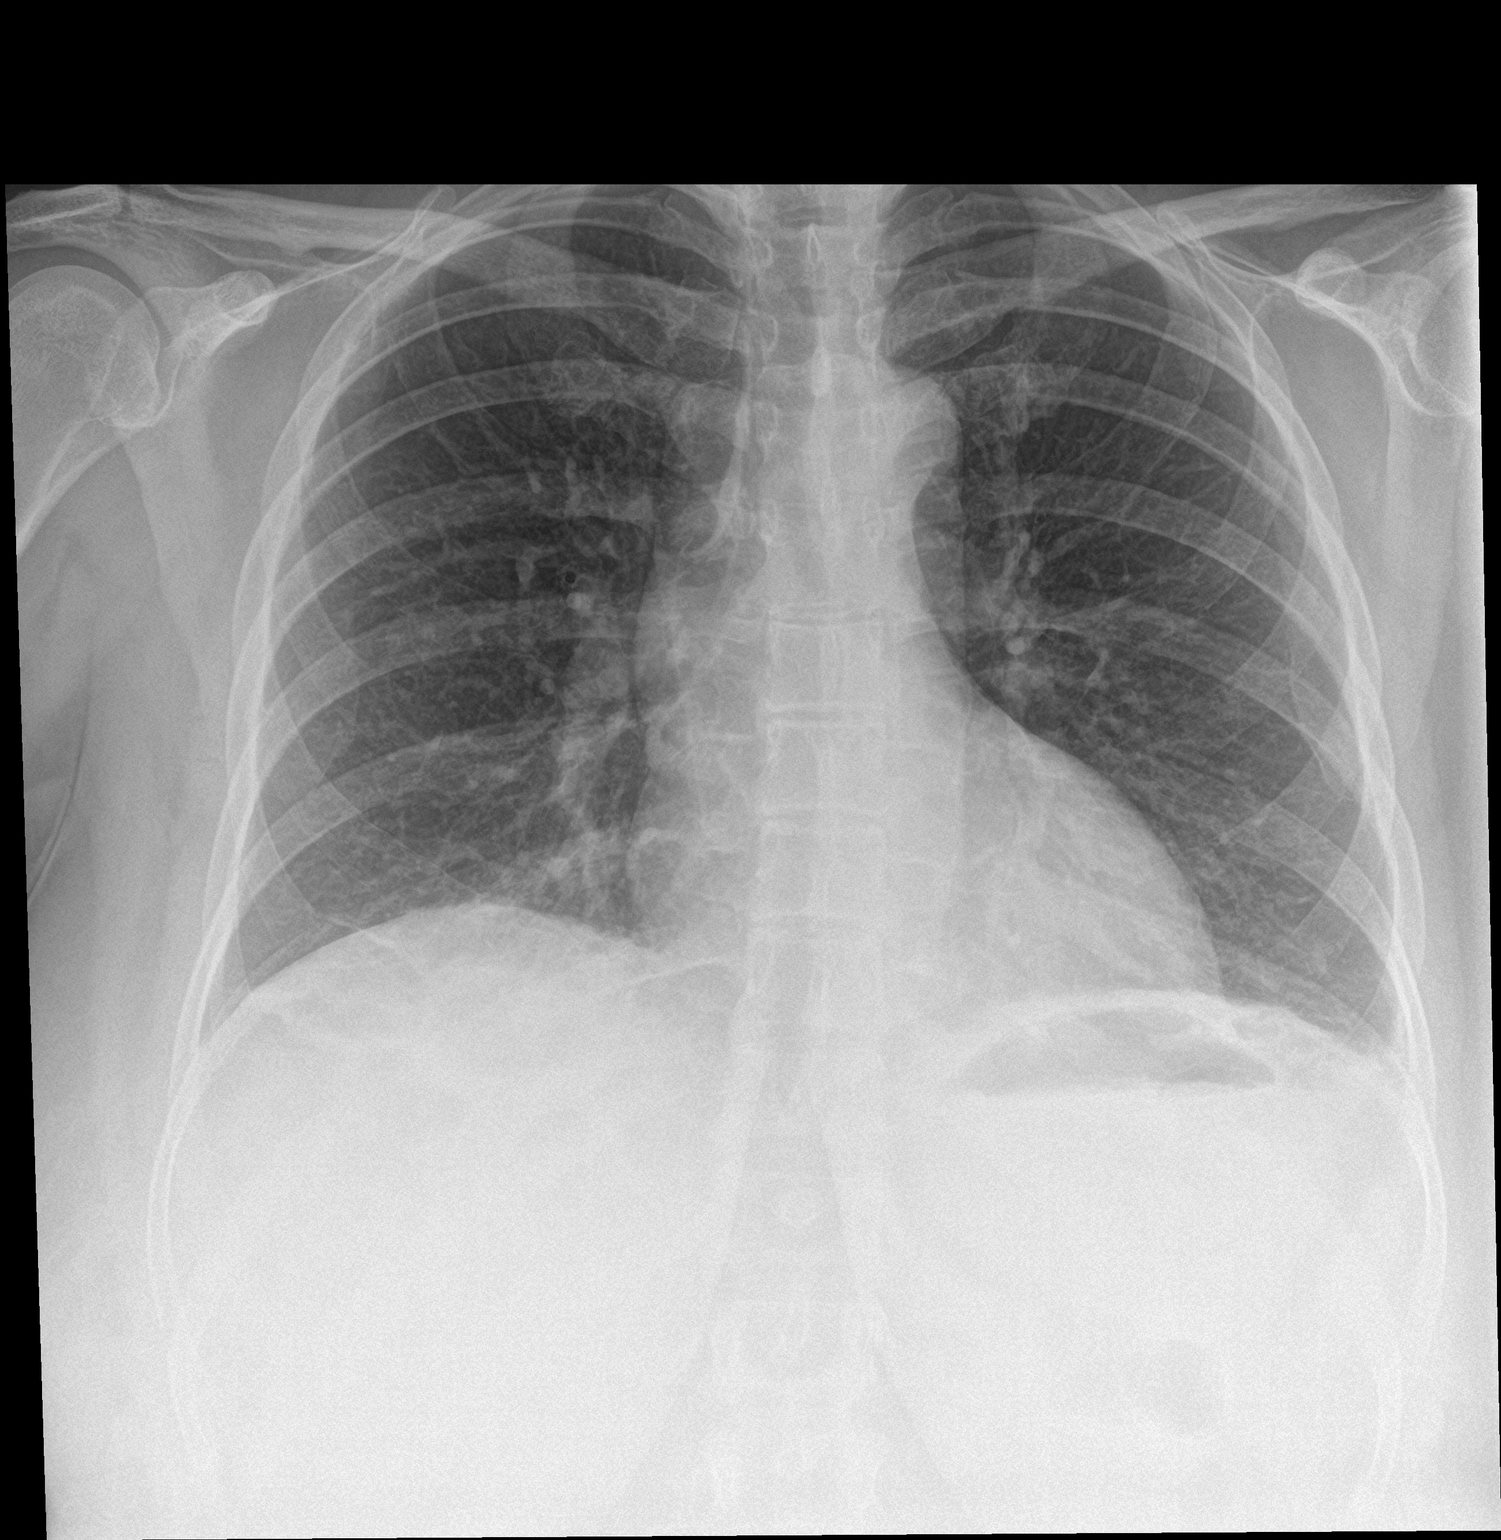

[chest lat]
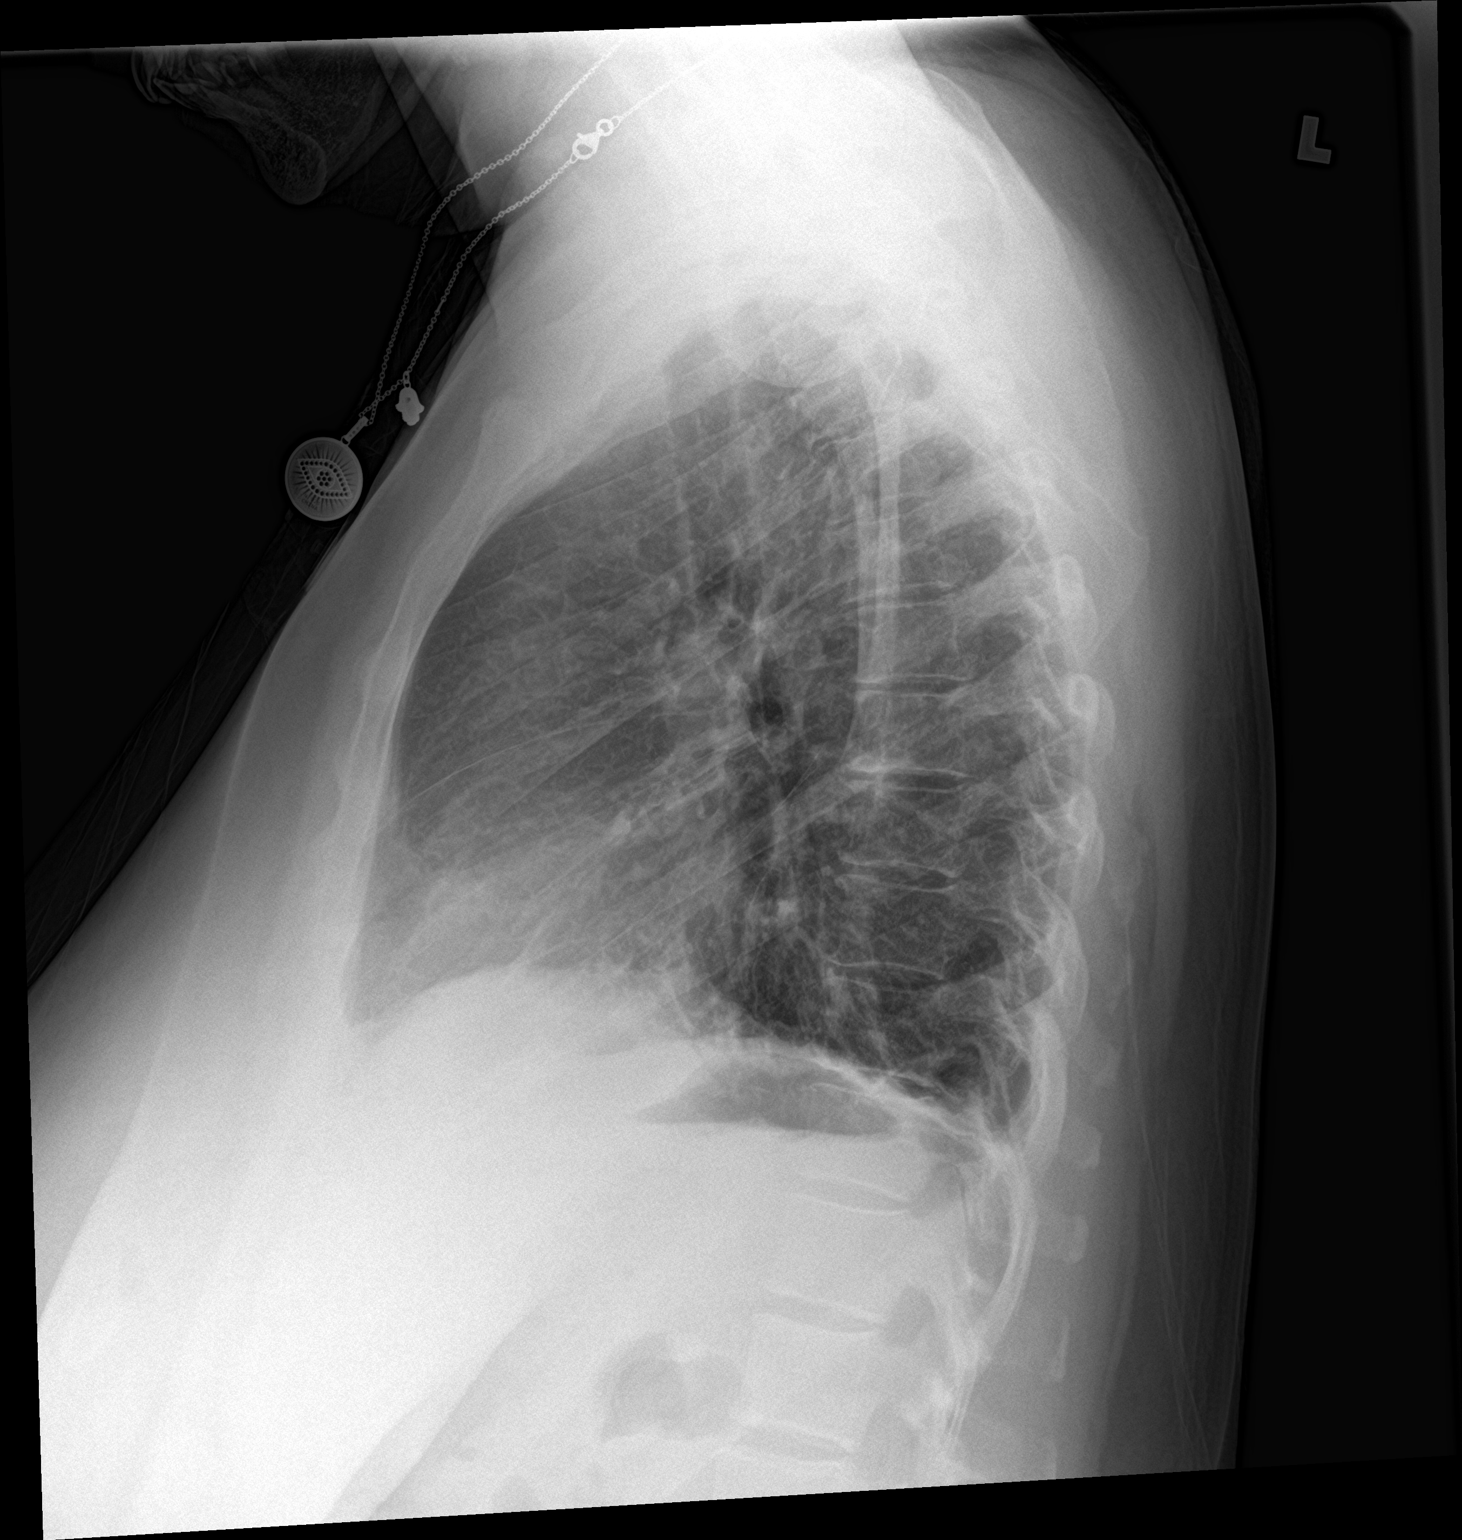

[2 of 2 positions shown; findings below may reference images not displayed]

FINDINGS: Cardiomediastinal silhouette is normal. Mediastinal contours appear
intact.

No pleural effusion or pneumothorax. Bibasilar atelectasis versus
airspace consolidation.

Osseous structures are without acute abnormality. Soft tissues are
grossly normal.
IMPRESSION: Bibasilar atelectasis versus airspace consolidation.

## 2021-06-24 IMAGING — CT CT ANGIOGRAPHY CHEST
1 of 6 series · 3 of 16 positions shown · IV contrast (omnipaque)
Comparison: None.

CLINICAL DATA: Recurrent chest pain radiating to left shoulder.

EXAM:
CT ANGIOGRAPHY CHEST WITH CONTRAST
TECHNIQUE: Multidetector CT imaging of the chest was performed using the
standard protocol during bolus administration of intravenous
contrast. Multiplanar CT image reconstructions and MIPs were
obtained to evaluate the vascular anatomy.
CONTRAST:  75mL OMNIPAQUE IOHEXOL 350 MG/ML SOLN

[Series 7: pe thins · axial · 0.85mm/px · z∈[+985,+1082]mm · 3 of 279 slices shown]
[im 70/279  lung]
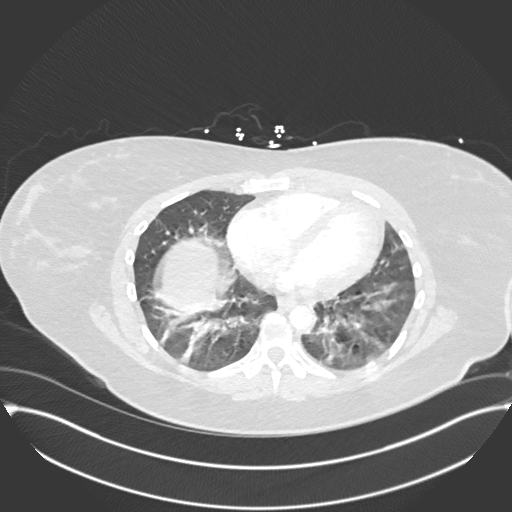
[im 140/279  soft-tissue]
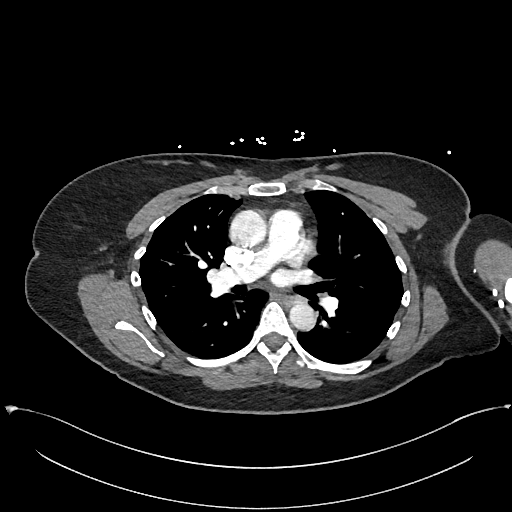
[im 209/279  lung]
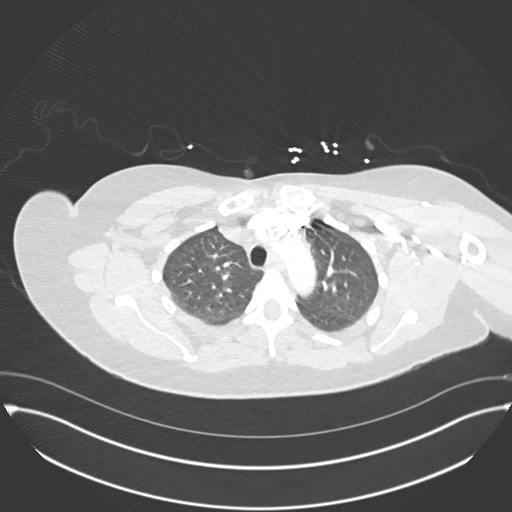

[3 of 16 positions shown; findings below may reference images not displayed]

FINDINGS: Cardiovascular: Contrast injection is sufficient to demonstrate
satisfactory opacification of the pulmonary arteries to the
segmental level. There is no pulmonary embolus. The main pulmonary
artery is within normal limits for size. There is no CT evidence of
acute right heart strain. The visualized aorta is normal. Heart size
is normal, without pericardial effusion.

Mediastinum/Nodes:

--No mediastinal or hilar lymphadenopathy.

--No axillary lymphadenopathy.

--No supraclavicular lymphadenopathy.

--Normal thyroid gland.

--The esophagus is unremarkable

Lungs/Pleura: There is bibasilar atelectasis. The lung volumes are
somewhat low. There is a 4 mm pulmonary nodule in the lingula (axial
series 6, image 64). There is no pneumothorax. No large pleural
effusion.

Upper Abdomen: No acute abnormality. There is probable hepatic
steatosis.

Musculoskeletal: No chest wall abnormality. No acute or significant
osseous findings.

Review of the MIP images confirms the above findings.
IMPRESSION: 1. No PE identified.
2. Low lung volumes with bibasilar atelectasis.
3. 4 mm pulmonary nodule in the lingula as detailed above. No
follow-up needed if patient is low-risk. Non-contrast chest CT can
be considered in 12 months if patient is high-risk. This
recommendation follows the consensus statement: Guidelines for
Management of Incidental Pulmonary Nodules Detected on CT Images:
4. Probable hepatic steatosis.

## 2023-02-19 ENCOUNTER — Emergency Department: Payer: BC Managed Care – PPO

## 2023-02-19 ENCOUNTER — Encounter: Payer: Self-pay | Admitting: Emergency Medicine

## 2023-02-19 ENCOUNTER — Other Ambulatory Visit: Payer: Self-pay

## 2023-02-19 ENCOUNTER — Emergency Department
Admission: EM | Admit: 2023-02-19 | Discharge: 2023-02-19 | Disposition: A | Discharge status: Home / Self Care | Payer: BC Managed Care – PPO | Attending: Emergency Medicine | Admitting: Emergency Medicine

## 2023-02-19 DIAGNOSIS — W010XXA Fall on same level from slipping, tripping and stumbling without subsequent striking against object, initial encounter: Secondary | ICD-10-CM | POA: Diagnosis not present

## 2023-02-19 DIAGNOSIS — M25532 Pain in left wrist: Secondary | ICD-10-CM | POA: Insufficient documentation

## 2023-02-19 DIAGNOSIS — R519 Headache, unspecified: Secondary | ICD-10-CM | POA: Diagnosis not present

## 2023-02-19 DIAGNOSIS — M25512 Pain in left shoulder: Secondary | ICD-10-CM | POA: Diagnosis not present

## 2023-02-19 DIAGNOSIS — Y990 Civilian activity done for income or pay: Secondary | ICD-10-CM | POA: Diagnosis not present

## 2023-02-19 DIAGNOSIS — M25552 Pain in left hip: Secondary | ICD-10-CM | POA: Diagnosis not present

## 2023-02-19 DIAGNOSIS — M79602 Pain in left arm: Secondary | ICD-10-CM | POA: Insufficient documentation

## 2023-02-19 DIAGNOSIS — W19XXXA Unspecified fall, initial encounter: Secondary | ICD-10-CM

## 2023-02-19 MED ORDER — OXYCODONE-ACETAMINOPHEN 5-325 MG PO TABS
1.0000 | ORAL_TABLET | Freq: Once | ORAL | Status: AC
  Administered 2023-02-19: 1 via ORAL
  Filled 2023-02-19: qty 1

## 2023-02-19 MED ORDER — OXYCODONE-ACETAMINOPHEN 5-325 MG PO TABS: 1.0000 | ORAL_TABLET | Freq: Four times a day (QID) | ORAL | 0 refills | Status: AC | PRN

## 2023-02-19 NOTE — ED Notes (Signed)
Shoulder sling applied to L arm.

## 2023-02-19 NOTE — ED Triage Notes (Addendum)
Pt via POV from work. Pt had a fall at work Pt c/o L sided pain, states her entire L side is hurting. Pt states she is having Pt is workers compensation case. Denies any LOC. Denies any blood thinner. Pt is A&Ox4 and NAD

## 2023-02-19 NOTE — ED Notes (Signed)
Unknown where pt's specific pain is that this time, consulted Dr. Derrill Kay, EDP this RN decided to wait for imaging until a more in depth evaluation could be obtained.

## 2023-02-19 NOTE — ED Provider Notes (Signed)
[facility] Provider Note    Event Date/Time   First MD Initiated Contact with Patient 02/19/23 1804     (approximate)   History   Fall   HPI  Elizabeth Burnett is a 47 y.o. female who presents to the emergency department today after a fall.  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  She landed on her left side.  Immediately had pain to her left shoulder.  That is still where the pain is most severe.  She is unable to move her arm.  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  Since then however she has started developing pain in her left lower leg.  Additionally she has some neck pain.  She cannot remember if she hit her head.     Physical Exam   Triage Vital Signs: ED Triage Vitals  Encounter Vitals Group     BP 02/19/23 1646 132/78     Systolic BP Percentile --      Diastolic BP Percentile --      Pulse Rate 02/19/23 1646 88     Resp 02/19/23 1646 18     Temp 02/19/23 1646 98.5 F (36.9 C)     Temp Source 02/19/23 1646 Oral     SpO2 02/19/23 1646 98 %     Weight 02/19/23 1637 137 lb (62.1 kg)     Height 02/19/23 1637 5\' 1"  (1.549 m)     Head Circumference --      Peak Flow --      Pain Score 02/19/23 1637 10     Pain Loc --      Pain Education --      Exclude from Growth Chart --     Most recent vital signs: Vitals:   02/19/23 1646  BP: 132/78  Pulse: 88  Resp: 18  Temp: 98.5 F (36.9 C)  SpO2: 98%   General: Awake, alert, oriented. CV:  Good peripheral perfusion. Regular rate and rhythm. Resp:  Normal effort. Lungs clear. Abd:  No distention.  Other:  Tender to palpation of the left shoulder and left wrist. NV intact. Minimal tenderness to palpation of the left hip.   ED Results / Procedures / Treatments   Labs (all labs ordered are listed, but only abnormal results are displayed) Labs Reviewed - No data to display   EKG  None   RADIOLOGY I independently  interpreted and visualized the CT head/cervical spine. My interpretation: No bleed. No fracture Radiology interpretation:  IMPRESSION:  1. No acute intracranial process.  2. No acute fracture or traumatic listhesis in the cervical spine.   I independently interpreted and visualized the left shoulder/humerus/forearm. My interpretation: No fracture, no dislocation Radiology interpretation:  IMPRESSION:  Negative radiographs of the left shoulder, humerus, and forearm.     PROCEDURES:  Critical Care performed: No  MEDICATIONS ORDERED IN ED: Medications - No data to display   IMPRESSION / MDM / ASSESSMENT AND PLAN / ED COURSE  I reviewed the triage vital signs and the nursing notes.                              Differential diagnosis includes, but is not limited to, fracture, dislocation, soft tissue injury, contusion.  Patient's presentation is most consistent with acute presentation with potential threat to life or bodily function.   Patient  presented to the emergency department today because of concerns for left-sided pain after fall.  Exam patient is most tender around the left elbow and left wrist.  Did obtain x-rays of the left upper arm which was negative for fracture or dislocation.  Did additionally obtain CT head and cervical spine given the patient could not remember if she hit her head and was complaints of neck pain.  Fortunately these imaging test were negative.  At this time do think patient likely suffered contusion and possible soft tissue injury.  Will place patient arm in a sling for comfort and give orthopedic follow-up information.   FINAL CLINICAL IMPRESSION(S) / ED DIAGNOSES   Final diagnoses:  Fall, initial encounter  Left arm pain    Note:  This document was prepared using Dragon voice recognition software and may include unintentional dictation errors.    Phineas Semen, MD 02/19/23 2055

## 2023-02-19 NOTE — ED Notes (Signed)
Workers comp being filled out by [employer]

## 2023-06-11 ENCOUNTER — Telehealth: Payer: Self-pay

## 2023-06-11 NOTE — Telephone Encounter (Signed)
 Reminder letter mailed for patient

## 2023-07-08 ENCOUNTER — Ambulatory Visit (INDEPENDENT_AMBULATORY_CARE_PROVIDER_SITE_OTHER): Admitting: Plastic Surgery

## 2023-07-08 ENCOUNTER — Encounter: Payer: Self-pay | Admitting: Plastic Surgery

## 2023-07-08 ENCOUNTER — Telehealth: Payer: Self-pay

## 2023-07-08 VITALS — BP 178/96 | HR 72 | Ht 61.0 in | Wt 143.0 lb

## 2023-07-08 DIAGNOSIS — M546 Pain in thoracic spine: Secondary | ICD-10-CM | POA: Diagnosis not present

## 2023-07-08 DIAGNOSIS — N62 Hypertrophy of breast: Secondary | ICD-10-CM | POA: Diagnosis not present

## 2023-07-08 DIAGNOSIS — N6489 Other specified disorders of breast: Secondary | ICD-10-CM | POA: Diagnosis not present

## 2023-07-08 DIAGNOSIS — M542 Cervicalgia: Secondary | ICD-10-CM

## 2023-07-08 NOTE — Progress Notes (Signed)
 Referring Provider Erskine Emery, NP [address] [place],  [place] [address]   CC:  Chief Complaint  Patient presents with   Advice Only   Skin Problem      Elizabeth Burnett is an 48 y.o. female.  HPI: Elizabeth Burnett is a 48 year old female who presents today with complaints of upper back and neck pain of many years duration.  She states she has had neck issues for many years and has been diagnosed in the past with spondylolithiasis and arthritis.  Additionally she notes that she often sits in a forward leaning posture and has muscular discomfort in her shoulders and neck.  She feels that this is due to the large size of her breast.  She began breast development early in life and has always been large chested with breasts out of proportion to her body size.  She states that her breast became much larger during the pregnancies with her children but never reduced in size.  She is interested in a bilateral breast reduction.  Of note she also has 3 lumps in her breast per her report.  She states that these were found on mammographic screening and she does not palpate them on self-exam.  No Known Allergies  Outpatient Encounter Medications as of 07/08/2023  Medication Sig   oxyCODONE-acetaminophen (PERCOCET) 5-325 MG tablet Take 1 tablet by mouth every 6 (six) hours as needed for severe pain (pain score 7-10).   rosuvastatin (CRESTOR) 10 MG tablet Take 1 tablet (10 mg total) by mouth daily.   telmisartan-hydrochlorothiazide (MICARDIS HCT) 40-12.5 MG tablet Take 1 tablet by mouth daily.   No facility-administered encounter medications on file as of 07/08/2023.     Past Medical History:  Diagnosis Date   Ulcerative colitis (HCC)     Past Surgical History:  Procedure Laterality Date   ACHILLES TENDON SURGERY Right    APPENDECTOMY      Family History  Problem Relation Age of Onset   Asthma Mother    Hypertension Mother    Hypertension Father    Diabetes Father    Hypertension Maternal  Grandmother    Heart attack Maternal Grandmother    Stroke Maternal Grandfather    Heart disease Maternal Grandfather    Aneurysm Maternal Grandfather     Social History   Social History Narrative   Not on file     Review of Systems General: Denies fevers, chills, weight loss CV: Denies chest pain, shortness of breath, palpitations Breast: Large breasts which she feels are contributing to her upper back and neck pain.  No history of breast cancer but she has been told that she has benign lumps in her breast.  Physical Exam    07/08/2023    7:53 AM 02/19/2023    4:46 PM 02/19/2023    4:37 PM  Vitals with BMI  Height 5\' 1"   5\' 1"   Weight 143 lbs  137 lbs  BMI 27.03  25.9  Systolic 178 132   Diastolic 96 78   Pulse 72 88     General:  No acute distress,  Alert and oriented, Non-Toxic, Normal speech and affect Breast: Patient has very large breasts which are out of proportion to her body size.  On physical examination they are dense but I do not feel any discrete masses.  Her sternal notch nipple distance on the right is 34 cm and 37 cm on the left her nipple to fold distance on the right is 22 cm and  21 cm on the left. Mammogram: Patient reports mammogram with benign findings.  We do not have access to her mammogram and have asked her to sign a medical release form so that we can obtain them. Assessment/Plan Hypertension: Patient's blood pressure is elevated today.  She states that she has been given antihypertensives but she has not started taking them.  She is strongly encouraged to begin their use today.  Benign breast lumps: Patient states that she has had breast findings on mammogram.  She is very concerned about these.  We have discussed it and she understands that a breast reduction will not necessarily remove the masses as I do not have any way to localize them ahead of time and no way to ensure that they are removed with the specimen.  I have strongly recommended that she  speak with a general surgeon prior to scheduling her breast reduction.  She lives close to [facility] and I have given her the name of Dr. Lequita Halt who is in the [place] area.  Symptomatic macromastia: Patient has very large breasts that are well out of proportion to her body size.  She has notable asymmetry with the left breast heavier than the right I believe that I can remove 800 g per breast as a minimum.  I believe that she would derive benefit from a breast reduction though she understands if she has neck pain from arthritis that this will not be completely relieved.  We discussed breast reductions at length including the location of the incisions and the unpredictable nature of scarring and wound healing.  We discussed the risks of bleeding, infection, and seroma formation.  She understands I will use drains postoperatively to help decrease the risk of seroma.  We discussed the risk of nipple loss due to nipple ischemia.  I was very clear with the patient that the use of nicotine products significantly increases the risk of nipple loss and I will not perform a breast reduction on her if she is nicotine positive at her preoperative exam.  We discussed the postoperative restrictions including no heavy lifting greater than 20 pounds, no vigorous activity, and no submerging the incisions in water for 6 weeks.  She may return to light activity as tolerated and is strongly encouraged to begin ambulating the day of surgery to help decrease the risk of DVT.  All questions were answered to her satisfaction.  Photographs were obtained today with her consent.  Will schedule her for bilateral breast reduction at her request.  Santiago Glad 07/08/2023, 8:43 AM

## 2023-07-08 NOTE — Telephone Encounter (Signed)
 Patient stated her PCP Drusilla Kanner, FNP) had her most recent mammogram with benign biopsy results.   Faxed request for both reports to Ms. Irving Burton, FNP to: [phone] with confirmed receipt.

## 2023-10-16 ENCOUNTER — Other Ambulatory Visit: Payer: Self-pay

## 2023-10-16 DIAGNOSIS — I10 Essential (primary) hypertension: Secondary | ICD-10-CM | POA: Insufficient documentation

## 2023-10-16 DIAGNOSIS — K5190 Ulcerative colitis, unspecified, without complications: Secondary | ICD-10-CM | POA: Insufficient documentation

## 2023-10-16 DIAGNOSIS — R079 Chest pain, unspecified: Secondary | ICD-10-CM | POA: Insufficient documentation

## 2023-10-16 DIAGNOSIS — F419 Anxiety disorder, unspecified: Secondary | ICD-10-CM | POA: Insufficient documentation

## 2023-10-16 DIAGNOSIS — R002 Palpitations: Secondary | ICD-10-CM | POA: Insufficient documentation

## 2023-10-16 DIAGNOSIS — R519 Headache, unspecified: Secondary | ICD-10-CM | POA: Insufficient documentation

## 2023-10-22 ENCOUNTER — Ambulatory Visit
